# Patient Record
Sex: Female | Born: 1985 | Race: White | Hispanic: No | Marital: Married | Smoking: Never smoker
Health system: Southern US, Community
[De-identification: ages and names within clinical notes are randomized; demographics above are authoritative.]

## PROBLEM LIST (undated history)

## (undated) DIAGNOSIS — O139 Gestational [pregnancy-induced] hypertension without significant proteinuria, unspecified trimester: Secondary | ICD-10-CM

## (undated) HISTORY — PX: LYMPH NODE DISSECTION: SHX5087

---

## 2012-08-29 ENCOUNTER — Other Ambulatory Visit (HOSPITAL_COMMUNITY)
Admission: RE | Admit: 2012-08-29 | Discharge: 2012-08-29 | Disposition: A | Discharge status: Home / Self Care | Payer: 59 | Source: Ambulatory Visit | Attending: Family Medicine | Admitting: Family Medicine

## 2012-08-29 DIAGNOSIS — Z124 Encounter for screening for malignant neoplasm of cervix: Secondary | ICD-10-CM | POA: Insufficient documentation

## 2014-07-26 NOTE — L&D Delivery Note (Signed)
Delivery Note - asked to attend birth for Dr Cherly Hensenousins  First Stage: Labor onset: [phone] with AROM (induction of labor with cytotec overnight) Augmentation :  pitocin Analgesia /Anesthesia intrapartum: epidural AROM at 0548  Second Stage: Complete dilation at 0839 Onset of pushing at 1100 FHR second stage category 2  Delivery of a viable female Patt Mead(Emma)  at 1204 by CNM in LOA position no nuchal cord Cord double clamped after cessation of pulsation, cut by FOB Cord blood sample collected   Third Stage: [facility] @ 1207 Placenta disposition: hospital disposal Uterine tone initially boggy but responsive to massage / bleeding brisk  supraurtethral laceration identified with brisk bleeding - pressure to site until suture available to provider Anesthesia for repair: epidural Straight red catheter placed for landmark - no laceration to urethra - laceration stops 1/2 cm above urethra Repair 4-0 supra-urethral down left labial margin Small vaginal laceration behind hymenal band - repaired with two suture for hemostasis Perineum intact Est. Blood Loss (mL): 400  Complications: none  Mom to postpartum.  Baby to Couplet care / Skin to Skin.  Newborn: Birth Weight: 7 pounds 9.2 ounces Apgar Scores: 9-9 Feeding planned: breast  Marlinda MikeBAILEY, Aarushi Hemric CNM, MSN, FACNM 07/15/2015, 12:26 PM

## 2014-08-31 ENCOUNTER — Encounter (HOSPITAL_COMMUNITY): Payer: Self-pay | Admitting: Emergency Medicine

## 2014-08-31 ENCOUNTER — Emergency Department (HOSPITAL_COMMUNITY)
Admission: EM | Admit: 2014-08-31 | Discharge: 2014-09-01 | Disposition: A | Discharge status: Home / Self Care | Payer: Worker's Compensation | Attending: Emergency Medicine | Admitting: Emergency Medicine

## 2014-08-31 DIAGNOSIS — Z7721 Contact with and (suspected) exposure to potentially hazardous body fluids: Secondary | ICD-10-CM | POA: Diagnosis not present

## 2014-08-31 DIAGNOSIS — T7589XA Other specified effects of external causes, initial encounter: Secondary | ICD-10-CM

## 2014-08-31 NOTE — ED Provider Notes (Signed)
CSN: [id number]     Arrival date & time 08/31/14  2327 History  This chart was scribed for Joycie PeekBenjamin Voshon Petro, PA-C working with Arby BarretteMarcy Pfeiffer, MD by Elveria Risingimelie Horne, ED Scribe. This patient was seen in room TR09C/TR09C and the patient's care was started at 11:58 PM.   Chief Complaint  Patient presents with  . Body Fluid Exposure   The history is provided by the patient. No language interpreter was used.   HPI Comments: Marlowe AschoffKara C Protzman is a 29 y.o. female who presents to the Emergency Department complaining of tracheal fluid exposure tonight, while on the job. Patient works as Museum/gallery exhibitions officerMT; she states that tonight a Counsellorfellow medic was suctioning out a trach, went too far and the person began gagging. The person coughed up bloody sputum that flew into the patient's mouth, which she then swallowed. Denies any other medical problems at this time. Marland Kitchen.History reviewed. No pertinent past medical history. Past Surgical History  Procedure Laterality Date  . Lymph node dissection      Left shoulder   No family history on file. History  Substance Use Topics  . Smoking status: Never Smoker   . Smokeless tobacco: Not on file  . Alcohol Use: No   OB History    No data available     Review of Systems  Constitutional: Negative for fever.  Respiratory: Negative for shortness of breath.   Cardiovascular: Negative for chest pain.  Gastrointestinal: Negative for abdominal pain.      Allergies  Review of patient's allergies indicates no known allergies.  Home Medications   Prior to Admission medications   Not on File   Triage Vitals: BP 124/75 mmHg  Pulse 90  Temp(Src) 98.7 F (37.1 C) (Oral)  Resp 16  SpO2 98%  LMP 08/10/2014 (Exact Date) Physical Exam  Constitutional: She is oriented to person, place, and time. She appears well-developed and well-nourished. No distress.  HENT:  Head: Normocephalic and atraumatic.  Eyes: EOM are normal.  Neck: Neck supple. No tracheal deviation present.   Cardiovascular: Normal rate, regular rhythm and normal heart sounds.   Pulmonary/Chest: Effort normal. No respiratory distress.  Musculoskeletal: Normal range of motion.  Neurological: She is alert and oriented to person, place, and time.  Skin: Skin is warm and dry.  Psychiatric: She has a normal mood and affect. Her behavior is normal.  Nursing note and vitals reviewed.   ED Course  Procedures (including critical care time)  COORDINATION OF CARE: 12:01 AM- Discussed treatment plan with patient at bedside and patient agreed to plan.   Labs Review Labs Reviewed - No data to display  Imaging Review No results found.   EKG Interpretation None     Rapid HIV reported negative. MDM  Patient here for evaluation after potential exposure. Encounter performed per blood borne exposure protocol. Rapid HIV negative. Patient will be discharged home to follow-up with [facility] in one business day for postexposure counseling and necessary lab follow-up Final diagnoses:  Exposure, initial encounter    I personally performed the services described in this documentation, which was scribed in my presence. The recorded information has been reviewed and is accurate.    Earle GellBenjamin W Beardstownartner, PA-C 09/01/14 0102  Arby BarretteMarcy Pfeiffer, MD 09/03/14 [phone]

## 2014-08-31 NOTE — ED Notes (Signed)
Pt was exposed to tracheal fluid while on the job. Pt here for exposure testing.

## 2014-12-02 LAB — OB RESULTS CONSOLE GC/CHLAMYDIA
Chlamydia: NEGATIVE
GC PROBE AMP, GENITAL: NEGATIVE

## 2014-12-02 LAB — OB RESULTS CONSOLE RUBELLA ANTIBODY, IGM: Rubella: IMMUNE

## 2014-12-02 LAB — OB RESULTS CONSOLE HEPATITIS B SURFACE ANTIGEN: Hepatitis B Surface Ag: NEGATIVE

## 2014-12-02 LAB — OB RESULTS CONSOLE RPR: RPR: NONREACTIVE

## 2014-12-02 LAB — OB RESULTS CONSOLE HIV ANTIBODY (ROUTINE TESTING): HIV: NONREACTIVE

## 2014-12-02 LAB — OB RESULTS CONSOLE ABO/RH: RH Type: POSITIVE

## 2015-06-05 ENCOUNTER — Ambulatory Visit (INDEPENDENT_AMBULATORY_CARE_PROVIDER_SITE_OTHER): Payer: 59 | Admitting: Cardiology

## 2015-06-05 ENCOUNTER — Encounter: Payer: Self-pay | Admitting: Cardiology

## 2015-06-05 VITALS — BP 124/74 | HR 93 | Ht 67.0 in | Wt 219.0 lb

## 2015-06-05 DIAGNOSIS — R000 Tachycardia, unspecified: Secondary | ICD-10-CM | POA: Diagnosis not present

## 2015-06-05 DIAGNOSIS — R0609 Other forms of dyspnea: Secondary | ICD-10-CM

## 2015-06-05 DIAGNOSIS — R002 Palpitations: Secondary | ICD-10-CM | POA: Diagnosis not present

## 2015-06-05 DIAGNOSIS — R0600 Dyspnea, unspecified: Secondary | ICD-10-CM

## 2015-06-05 MED ORDER — LABETALOL HCL 100 MG PO TABS: 100.0000 mg | ORAL_TABLET | Freq: Two times a day (BID) | ORAL | Status: DC

## 2015-06-05 NOTE — Patient Instructions (Signed)
Medication Instructions:   START TAKING LABETALOL 100 MG TWICE DAILY     Testing/Procedures:  Your physician has requested that you have an echocardiogram. Echocardiography is a painless test that uses sound waves to create images of your heart. It provides your doctor with information about the size and shape of your heart and how well your heart's chambers and valves are working. This procedure takes approximately one hour. There are no restrictions for this procedure.    Follow-Up:  6 WEEKS WITH DR Delton SeeNELSON     If you need a refill on your cardiac medications before your next appointment, please call your pharmacy.

## 2015-06-05 NOTE — Progress Notes (Signed)
Patient ID: Tammie Ortiz, female   DOB: 03/24/1986, 29 y.o.   MRN: [id number]      Cardiology Office Note  Date:  06/05/2015   ID:  Tammie AschoffKara C Ortiz, DOB 06/02/1986, MRN [id number]  PCP:  Ronnie DossWILLARD,JENNIFER, PA-C  Cardiologist:  Lars MassonNELSON, Chrisoula Zegarra H, MD   Chief complain: tachycardia, palpitations   History of Present Illness: Tammie Ortiz is a 29 y.o. female who presents for evaluation of tachycardia and palpitations. She is currently [redacted] weeks pregnant, this is her 1. Pregnancy, pretty uncomplicated. 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 HR would go up to 140 and would fluctutae to 90' then back to 130. Associated SOB and mild dizziness, no syncope, no chest pain. Mild LE edema at the end of days that resolves by the next day. No orthopnea, PND.  She is otherwise completely healthy, no prior h/o heart disease. Father had MI in his 4140'.   No past medical history on file.  Past Surgical History  Procedure Laterality Date  . Lymph node dissection      Left shoulder    Current Outpatient Prescriptions  Medication Sig Dispense Refill  . Prenatal Vit-Fe Fumarate-FA (PRENATAL MULTIVITAMIN) TABS tablet Take 1 tablet by mouth daily at 12 noon.     No current facility-administered medications for this visit.   Allergies:   Latex and Percocet   Social History:  The patient  reports that she has never smoked. She does not have any smokeless tobacco history on file. She reports that she does not drink alcohol.   Family History:  The patient's family history is not on file.    ROS:  Please see the history of present illness.   Otherwise, review of systems are positive for none.   All other systems are reviewed and negative.    PHYSICAL EXAM: VS:  BP 124/74 mmHg  Pulse 93  Ht 5\' 7"  (1.702 m)  Wt 219 lb (99.338 kg)  BMI 34.29 kg/m2 , BMI Body mass index is 34.29 kg/(m^2). GEN: Well nourished, well developed,  in no acute distress HEENT: normal Neck: no JVD, carotid bruits, or masses Cardiac: RRR; no murmurs, rubs, or gallops,no edema  Respiratory:  clear to auscultation bilaterally, normal work of breathing GI: soft, nontender, nondistended, + BS MS: no deformity or atrophy Skin: warm and dry, no rash Neuro:  Strength and sensation are intact Psych: euthymic mood, full affect  EKG:  EKG is ordered today. The ekg ordered today demonstrates SR with sinus arrhythmia, otherwise normal.   Recent Labs: No results found for requested labs within last 365 days.   Lipid Panel No results found for: CHOL, TRIG, HDL, CHOLHDL, VLDL, LDLCALC, LDLDIRECT   Wt Readings from Last 3 Encounters:  06/05/15 219 lb (99.338 kg)      ASSESSMENT AND PLAN:  1.  Palpitations, sinus tachycardia, DOE - EMS strips show sinus tachycardia only - we will start labetalol 100 mg po BID, she can take it as needed, we will order an echocardiogram to evaluate for systolic function. No signs of CHF. TSH was normal. Follow up in 6 weeks, early postpartum.   Signed, Lars MassonNELSON, Journee Kohen H, MD  06/05/2015 2:52 PM    [facility] [address], [place], [place]  [address] Phone: [phone]; Fax: [phone]

## 2015-06-11 LAB — OB RESULTS CONSOLE GBS: STREP GROUP B AG: NEGATIVE

## 2015-06-13 ENCOUNTER — Other Ambulatory Visit: Payer: Self-pay

## 2015-06-13 ENCOUNTER — Telehealth: Payer: Self-pay | Admitting: Cardiology

## 2015-06-13 ENCOUNTER — Ambulatory Visit (HOSPITAL_COMMUNITY): Payer: 59 | Attending: Cardiology

## 2015-06-13 DIAGNOSIS — Z3A34 34 weeks gestation of pregnancy: Secondary | ICD-10-CM | POA: Insufficient documentation

## 2015-06-13 DIAGNOSIS — R000 Tachycardia, unspecified: Secondary | ICD-10-CM | POA: Insufficient documentation

## 2015-06-13 NOTE — Telephone Encounter (Signed)
New problem   Pt has questions about the Labetalol 100mg  that she takes. Please call pt

## 2015-06-13 NOTE — Telephone Encounter (Signed)
Pt.notified

## 2015-06-13 NOTE — Telephone Encounter (Signed)
She can take labetalol as needed, her echocardiogram is normal.

## 2015-06-13 NOTE — Telephone Encounter (Signed)
Called patient about her message. Patient is pregnant and worried about taking Labetalol, but patient has not had any episodes of ST. Informed patient that according to Dr. Lindaann SloughNelson's office note, that she could take as needed. Patient wants to know if it is okay to not take at all, and maybe see if there is something else she can take. Will forward to Dr. Delton SeeNelson for advisement.

## 2015-07-14 ENCOUNTER — Encounter (HOSPITAL_COMMUNITY): Payer: Self-pay | Admitting: *Deleted

## 2015-07-14 ENCOUNTER — Inpatient Hospital Stay (HOSPITAL_COMMUNITY)
Admission: AD | Admit: 2015-07-14 | Discharge: 2015-07-17 | DRG: 775 | Disposition: A | Discharge status: Home / Self Care | Payer: 59 | Source: Ambulatory Visit | Attending: Obstetrics and Gynecology | Admitting: Obstetrics and Gynecology

## 2015-07-14 DIAGNOSIS — O99214 Obesity complicating childbirth: Secondary | ICD-10-CM | POA: Diagnosis present

## 2015-07-14 DIAGNOSIS — O134 Gestational [pregnancy-induced] hypertension without significant proteinuria, complicating childbirth: Secondary | ICD-10-CM | POA: Diagnosis present

## 2015-07-14 DIAGNOSIS — O480 Post-term pregnancy: Secondary | ICD-10-CM | POA: Diagnosis present

## 2015-07-14 DIAGNOSIS — Z3A40 40 weeks gestation of pregnancy: Secondary | ICD-10-CM

## 2015-07-14 DIAGNOSIS — O133 Gestational [pregnancy-induced] hypertension without significant proteinuria, third trimester: Secondary | ICD-10-CM

## 2015-07-14 DIAGNOSIS — Z6837 Body mass index (BMI) 37.0-37.9, adult: Secondary | ICD-10-CM | POA: Diagnosis not present

## 2015-07-14 DIAGNOSIS — O139 Gestational [pregnancy-induced] hypertension without significant proteinuria, unspecified trimester: Secondary | ICD-10-CM | POA: Diagnosis present

## 2015-07-14 HISTORY — DX: Gestational (pregnancy-induced) hypertension without significant proteinuria, unspecified trimester: O13.9

## 2015-07-14 LAB — TYPE AND SCREEN
ABO/RH(D): B POS
ANTIBODY SCREEN: NEGATIVE

## 2015-07-14 LAB — URIC ACID: Uric Acid, Serum: 5.5 mg/dL (ref 2.3–6.6)

## 2015-07-14 LAB — CBC
HCT: 35.9 % — ABNORMAL LOW (ref 36.0–46.0)
HEMOGLOBIN: 11.9 g/dL — AB (ref 12.0–15.0)
MCH: 27.4 pg (ref 26.0–34.0)
MCHC: 33.1 g/dL (ref 30.0–36.0)
MCV: 82.5 fL (ref 78.0–100.0)
Platelets: 238 10*3/uL (ref 150–400)
RBC: 4.35 MIL/uL (ref 3.87–5.11)
RDW: 14.4 % (ref 11.5–15.5)
WBC: 12.3 10*3/uL — ABNORMAL HIGH (ref 4.0–10.5)

## 2015-07-14 LAB — COMPREHENSIVE METABOLIC PANEL
ALBUMIN: 3.3 g/dL — AB (ref 3.5–5.0)
ALT: 18 U/L (ref 14–54)
ANION GAP: 10 (ref 5–15)
AST: 30 U/L (ref 15–41)
Alkaline Phosphatase: 175 U/L — ABNORMAL HIGH (ref 38–126)
BILIRUBIN TOTAL: 0.5 mg/dL (ref 0.3–1.2)
BUN: 11 mg/dL (ref 6–20)
CHLORIDE: 105 mmol/L (ref 101–111)
CO2: 21 mmol/L — AB (ref 22–32)
Calcium: 9.2 mg/dL (ref 8.9–10.3)
Creatinine, Ser: 0.6 mg/dL (ref 0.44–1.00)
GFR calc Af Amer: >60 mL/min (ref >60)
GFR calc non Af Amer: >60 mL/min (ref >60)
GLUCOSE: 78 mg/dL (ref 65–99)
POTASSIUM: 4.1 mmol/L (ref 3.5–5.1)
SODIUM: 136 mmol/L (ref 135–145)
TOTAL PROTEIN: 7.3 g/dL (ref 6.5–8.1)

## 2015-07-14 LAB — ABO/RH: ABO/RH(D): B POS

## 2015-07-14 LAB — PROTEIN / CREATININE RATIO, URINE
Creatinine, Urine: 108 mg/dL
PROTEIN CREATININE RATIO: 0.11 mg/mg{creat} (ref 0.00–0.15)
TOTAL PROTEIN, URINE: 12 mg/dL

## 2015-07-14 MED ORDER — ONDANSETRON HCL 4 MG/2ML IJ SOLN
4.0000 mg | Freq: Four times a day (QID) | INTRAMUSCULAR | Status: DC | PRN
  Administered 2015-07-15: 4 mg via INTRAVENOUS
  Filled 2015-07-14: qty 2

## 2015-07-14 MED ORDER — OXYTOCIN BOLUS FROM INFUSION: 500.0000 mL | INTRAVENOUS | Status: DC

## 2015-07-14 MED ORDER — BUTORPHANOL TARTRATE 2 MG/ML IJ SOLN
2.0000 mg | INTRAMUSCULAR | Status: DC | PRN
  Administered 2015-07-14: 2 mg via INTRAVENOUS
  Filled 2015-07-14: qty 2

## 2015-07-14 MED ORDER — TERBUTALINE SULFATE 1 MG/ML IJ SOLN
0.2500 mg | Freq: Once | INTRAMUSCULAR | Status: DC | PRN
  Filled 2015-07-14: qty 1

## 2015-07-14 MED ORDER — LACTATED RINGERS IV SOLN
500.0000 mL | INTRAVENOUS | Status: DC | PRN
  Administered 2015-07-15 (×2): 500 mL via INTRAVENOUS

## 2015-07-14 MED ORDER — LACTATED RINGERS IV SOLN
INTRAVENOUS | Status: DC
  Administered 2015-07-14: 15:00:00 via INTRAVENOUS
  Administered 2015-07-15: 1000 mL via INTRAVENOUS
  Administered 2015-07-15: 11:00:00 via INTRAVENOUS

## 2015-07-14 MED ORDER — MISOPROSTOL 25 MCG QUARTER TABLET
25.0000 ug | ORAL_TABLET | ORAL | Status: DC | PRN
  Administered 2015-07-14 – 2015-07-15 (×3): 25 ug via VAGINAL
  Filled 2015-07-14: qty 1
  Filled 2015-07-14 (×3): qty 0.25

## 2015-07-14 MED ORDER — ACETAMINOPHEN 500 MG PO TABS
1000.0000 mg | ORAL_TABLET | Freq: Once | ORAL | Status: AC
Start: 2015-07-14 — End: 2015-07-14
  Administered 2015-07-14: 1000 mg via ORAL
  Filled 2015-07-14: qty 2

## 2015-07-14 MED ORDER — CITRIC ACID-SODIUM CITRATE 334-500 MG/5ML PO SOLN: 30.0000 mL | ORAL | Status: DC | PRN

## 2015-07-14 MED ORDER — OXYTOCIN 40 UNITS IN LACTATED RINGERS INFUSION - SIMPLE MED
62.5000 mL/h | INTRAVENOUS | Status: DC
  Administered 2015-07-15: 999 mL/h via INTRAVENOUS
  Filled 2015-07-14: qty 1000

## 2015-07-14 MED ORDER — OXYTOCIN 10 UNIT/ML IJ SOLN: 10.0000 [IU] | Freq: Once | INTRAMUSCULAR | Status: DC

## 2015-07-14 MED ORDER — LABETALOL HCL 100 MG PO TABS
100.0000 mg | ORAL_TABLET | Freq: Two times a day (BID) | ORAL | Status: DC
  Administered 2015-07-14 – 2015-07-15 (×3): 100 mg via ORAL
  Filled 2015-07-14 (×4): qty 1

## 2015-07-14 MED ORDER — LIDOCAINE HCL (PF) 1 % IJ SOLN
30.0000 mL | INTRAMUSCULAR | Status: DC | PRN
  Filled 2015-07-14: qty 30

## 2015-07-14 NOTE — Progress Notes (Signed)
S: notes more cramping since 2nd cytotec( 9 pm) H/a better Declines pain med  O: BP 125/74 mmHg  Pulse 90  Temp(Src) 98.4 F (36.9 C) (Oral)  Resp 18  Ht 5\' 5"  (1.651 m)  Wt 102.967 kg (227 lb)  BMI 37.77 kg/m2  SpO2 98%  LMP 10/06/2014 VE deferred  Tracing: cat 1 Ctx not seen  IMP: postterm P) cont with cytotec. Pain med prn

## 2015-07-14 NOTE — MAU Provider Note (Signed)
History     CSN: [id number]  Arrival date and time: 07/14/15 1243   None     No chief complaint on file.  HPI Comments: G1 @40 .1 weeks sent from office for elevated BP's. Reports left temporal HA that started last night, took Tylenol w/relief then returned this am. Some nausea today, no vomiting. Blurry vision last night, none today. No epigastric pain. Good FM. No LOF, ctx, or VB. Pregnancy complicated by obesity, excessive weight gain, and new onset HTN.    OB History    Gravida Para Term Preterm AB TAB SAB Ectopic Multiple Living   1               History reviewed. No pertinent past medical history.  Past Surgical History  Procedure Laterality Date  . Lymph node dissection      Left shoulder    History reviewed. No pertinent family history.  Social History  Substance Use Topics  . Smoking status: Never Smoker   . Smokeless tobacco: Never Used  . Alcohol Use: No    Allergies:  Allergies  Allergen Reactions  . Latex   . Percocet [Oxycodone-Acetaminophen] Hives    Prescriptions prior to admission  Medication Sig Dispense Refill Last Dose  . labetalol (NORMODYNE) 100 MG tablet Take 1 tablet (100 mg total) by mouth 2 (two) times daily. 180 tablet 3   . Prenatal Vit-Fe Fumarate-FA (PRENATAL MULTIVITAMIN) TABS tablet Take 1 tablet by mouth daily at 12 noon.   Taking    Review of Systems  Constitutional: Negative.   Eyes: Positive for blurred vision.  Respiratory: Negative.   Cardiovascular: Negative.   Gastrointestinal: Negative.   Genitourinary: Negative.   Musculoskeletal: Negative.   Neurological: Positive for headaches.  Endo/Heme/Allergies: Negative.   Psychiatric/Behavioral: Negative.    Physical Exam   Blood pressure 133/85, pulse 94, temperature 98.1 F (36.7 C), resp. rate 18, last menstrual period 10/06/2014.  Physical Exam  Constitutional: She is oriented to person, place, and time. She appears well-developed and well-nourished.  HENT:   Head: Normocephalic.  Neck: Normal range of motion. Neck supple.  Cardiovascular: Normal rate and regular rhythm.   Respiratory: Effort normal and breath sounds normal.  GI: Soft. Bowel sounds are normal.  gravid  Genitourinary:  deferred  Musculoskeletal: Normal range of motion.  Neurological: She is alert and oriented to person, place, and time.  Skin: Skin is warm and dry.  Psychiatric: She has a normal mood and affect.   Results for Tammie Ortiz, Tammie Ortiz (MRN [id number]) as of 07/14/2015 13:40  Ref. Range 07/14/2015 12:51 07/14/2015 12:57  Sodium Latest Ref Range: 135-145 mmol/L  136  Potassium Latest Ref Range: 3.5-5.1 mmol/L  4.1  Chloride Latest Ref Range: 101-111 mmol/L  105  CO2 Latest Ref Range: 22-32 mmol/L  21 (L)  BUN Latest Ref Range: 6-20 mg/dL  11  Creatinine Latest Ref Range: 0.44-1.00 mg/dL  0.60  Calcium Latest Ref Range: 8.9-10.3 mg/dL  9.2  EGFR (Non-African Amer.) Latest Ref Range: >60 mL/min  >60  EGFR (African American) Latest Ref Range: >60 mL/min  >60  Glucose Latest Ref Range: 65-99 mg/dL  78  Anion gap Latest Ref Range: 5-15   10  Alkaline Phosphatase Latest Ref Range: 38-126 U/L  175 (H)  Albumin Latest Ref Range: 3.5-5.0 g/dL  3.3 (L)  Uric Acid, Serum Latest Ref Range: 2.3-6.6 mg/dL  5.5  AST Latest Ref Range: 15-41 U/L  30  ALT Latest Ref Range: 14-54 U/L  18  Total Protein Latest Ref Range: 6.5-8.1 g/dL  7.3  Total Bilirubin Latest Ref Range: 0.3-1.2 mg/dL  0.5  WBC Latest Ref Range: 4.0-10.5 K/uL  12.3 (H)  RBC Latest Ref Range: 3.87-5.11 MIL/uL  4.35  Hemoglobin Latest Ref Range: 12.0-15.0 g/dL  11.9 (L)  HCT Latest Ref Range: 36.0-46.0 %  35.9 (L)  MCV Latest Ref Range: 78.0-100.0 fL  82.5  MCH Latest Ref Range: 26.0-34.0 pg  27.4  MCHC Latest Ref Range: 30.0-36.0 g/dL  33.1  RDW Latest Ref Range: 11.5-15.5 %  14.4  Platelets Latest Ref Range: 150-400 K/uL  238  Total Protein, Urine Latest Units: mg/dL 12   Protein Creatinine Ratio Latest Ref  Range: 0.00-0.15 mg/mgCre 0.11   Creatinine, Urine Latest Units: mg/dL 108.00    MAU Course  Procedures   Assessment and Plan  40.[redacted] weeks gestation Gestational HTN-no evidence of PEC Reactive NST  Admit, IOL, Labetalol-see admit orders. Dr. Maudry Diego notified-agrees with A/P/ MD to manage  Julianne Handler, N 07/14/2015, 1:44 PM

## 2015-07-14 NOTE — Progress Notes (Signed)
S: notes h/a still Declined med S/p cytotec #1    O: BP 121/73 mmHg  Pulse 104  Temp(Src) 98.3 F (36.8 C) (Oral)  Resp 16  Ht 5\' 5"  (1.651 m)  Wt 102.967 kg (227 lb)  BMI 37.77 kg/m2  SpO2 98%  LMP 10/06/2014  VE deferred  Tracing: cat 1 irreg ctx CBC    Component Value Date/Time   WBC 12.3* 07/14/2015 1257   RBC 4.35 07/14/2015 1257   HGB 11.9* 07/14/2015 1257   HCT 35.9* 07/14/2015 1257   PLT 238 07/14/2015 1257   MCV 82.5 07/14/2015 1257   MCH 27.4 07/14/2015 1257   MCHC 33.1 07/14/2015 1257   RDW 14.4 07/14/2015 1257   CMP Latest Ref Rng 07/14/2015  Glucose 65 - 99 mg/dL 78  BUN 6 - 20 mg/dL 11  Creatinine 1.610.44 - 0.961.00 mg/dL 0.450.60  Sodium 409135 - 811145 mmol/L 136  Potassium 3.5 - 5.1 mmol/L 4.1  Chloride 101 - 111 mmol/L 105  CO2 22 - 32 mmol/L 21(L)  Calcium 8.9 - 10.3 mg/dL 9.2  Total Protein 6.5 - 8.1 g/dL 7.3  Total Bilirubin 0.3 - 1.2 mg/dL 0.5  Alkaline Phos 38 - 126 U/L 175(H)  AST 15 - 41 U/L 30  ALT 14 - 54 U/L 18      IMP: Gest HTN Postterm P) tylenol 1000mg  po x1. Cont cytotec for cervical ripening. Light labor meal now

## 2015-07-14 NOTE — Progress Notes (Signed)
Pt c/o "feeling weird" .  Pt hyperventilationg.  Pt encouraged to take deep breaths.  No c/o pain.  Some nausea. Encouragement given and education given regarding the induction process.  Answered questions for pt and FOB.

## 2015-07-14 NOTE — MAU Note (Signed)
Pt presents to MAU from Dr Cherly [facility]ousins [facility]. Denies any vaginal bleeding or LOF. Reports good fetal movement

## 2015-07-14 NOTE — H&P (Signed)
  OB ADMISSION/ HISTORY & PHYSICAL:  Admission Date: 07/14/2015 12:43 PM  Admit Diagnosis: 40.[redacted] weeks gestation, Gestational HTN  Tammie AschoffKara C Ortiz is a 29 y.o. female presenting for IOL for Gestational HTN.  Prenatal History: G1P0   EDC:07/13/2015, by Last Menstrual Period   [facility]  Primary Ob Provider: Dr. Cherly Hensenousins Prenatal course complicated by obesity, excessive weight gain, new onset of gestational HTN.   Prenatal Labs: ABO, Rh:   B Pos Antibody:  Neg Rubella:   Immune RPR:   NR HBsAg:   Neg HIV:   Neg GBS:   Neg 1 hr GTT: 111 Genetic screen: neg  Medical / Surgical History :  Past medical history: History reviewed. No pertinent past medical history.   Past surgical history:  Past Surgical History  Procedure Laterality Date  . Lymph node dissection      Left shoulder    Family History: History reviewed. No pertinent family history.   Social History:  reports that she has never smoked. She has never used smokeless tobacco. She reports that she does not drink alcohol or use illicit drugs.  Allergies: Latex and Percocet   Current Medications at time of admission:  Prior to Admission medications   Medication Sig Start Date End Date Taking? Authorizing Provider  labetalol (NORMODYNE) 100 MG tablet Take 1 tablet (100 mg total) by mouth 2 (two) times daily. 06/05/15   Lars MassonKatarina H Nelson, MD  Prenatal Vit-Fe Fumarate-FA (PRENATAL MULTIVITAMIN) TABS tablet Take 1 tablet by mouth daily at 12 noon.    Historical Provider, MD     Review of Systems: +FM +HA No visual disturbances or epigastric pain No LOF No ctx No VB  Physical Exam:  VS: Blood pressure 133/85, pulse 94, temperature 98.1 F (36.7 C), resp. rate 18, last menstrual period 10/06/2014.  General: alert and oriented, appears comfortable Heart: RRR Lungs: Clear lung fields Abdomen: Gravid, soft and non-tender, non-distended / uterus: gravid,  non-tender Extremities: No edema Genitalia / VE:   FHR: baseline rate 145 / variability mod / accelerations + / no decelerations TOCO: UI  Assessment: 40.[redacted] weeks gestation Labor: latent FHR category I GBS: neg  Plan:  Admit, Cytotec ripening, analgesia prn, continue Labetalol, anticipate SVD. Dr. Cherly Hensenousins notified of admission / plan of care /MD to manage   Lawernce PittsBHAMBRI, Gara Kincade, N MSN, CNM 07/14/2015, 1:45 PM

## 2015-07-15 ENCOUNTER — Encounter (HOSPITAL_COMMUNITY): Payer: Self-pay | Admitting: *Deleted

## 2015-07-15 ENCOUNTER — Inpatient Hospital Stay (HOSPITAL_COMMUNITY): Payer: 59 | Admitting: Anesthesiology

## 2015-07-15 LAB — CBC
HEMATOCRIT: 31.9 % — AB (ref 36.0–46.0)
HEMOGLOBIN: 10.5 g/dL — AB (ref 12.0–15.0)
MCH: 27.4 pg (ref 26.0–34.0)
MCHC: 32.9 g/dL (ref 30.0–36.0)
MCV: 83.3 fL (ref 78.0–100.0)
Platelets: 224 10*3/uL (ref 150–400)
RBC: 3.83 MIL/uL — AB (ref 3.87–5.11)
RDW: 14.8 % (ref 11.5–15.5)
WBC: 18.3 10*3/uL — AB (ref 4.0–10.5)

## 2015-07-15 LAB — RPR: RPR: NONREACTIVE

## 2015-07-15 MED ORDER — EPHEDRINE 5 MG/ML INJ
10.0000 mg | INTRAVENOUS | Status: DC | PRN
  Filled 2015-07-15: qty 2

## 2015-07-15 MED ORDER — BENZOCAINE-MENTHOL 20-0.5 % EX AERO
1.0000 "application " | INHALATION_SPRAY | CUTANEOUS | Status: DC | PRN
  Administered 2015-07-15: 1 via TOPICAL
  Filled 2015-07-15 (×2): qty 56

## 2015-07-15 MED ORDER — LANOLIN HYDROUS EX OINT: TOPICAL_OINTMENT | CUTANEOUS | Status: DC | PRN

## 2015-07-15 MED ORDER — SIMETHICONE 80 MG PO CHEW: 80.0000 mg | CHEWABLE_TABLET | ORAL | Status: DC | PRN

## 2015-07-15 MED ORDER — FENTANYL 2.5 MCG/ML BUPIVACAINE 1/10 % EPIDURAL INFUSION (WH - ANES)
14.0000 mL/h | INTRAMUSCULAR | Status: DC | PRN
  Administered 2015-07-15 (×2): 14 mL/h via EPIDURAL
  Filled 2015-07-15 (×2): qty 125

## 2015-07-15 MED ORDER — LIDOCAINE HCL (PF) 1 % IJ SOLN
INTRAMUSCULAR | Status: DC | PRN
  Administered 2015-07-15: 2 mL via EPIDURAL
  Administered 2015-07-15 (×2): 4 mL via EPIDURAL

## 2015-07-15 MED ORDER — PHENYLEPHRINE 40 MCG/ML (10ML) SYRINGE FOR IV PUSH (FOR BLOOD PRESSURE SUPPORT)
80.0000 ug | PREFILLED_SYRINGE | INTRAVENOUS | Status: DC | PRN
  Filled 2015-07-15: qty 20
  Filled 2015-07-15: qty 2

## 2015-07-15 MED ORDER — DIPHENHYDRAMINE HCL 25 MG PO CAPS: 25.0000 mg | ORAL_CAPSULE | Freq: Four times a day (QID) | ORAL | Status: DC | PRN

## 2015-07-15 MED ORDER — TRAMADOL HCL 50 MG PO TABS: 50.0000 mg | ORAL_TABLET | Freq: Four times a day (QID) | ORAL | Status: DC | PRN

## 2015-07-15 MED ORDER — WITCH HAZEL-GLYCERIN EX PADS: 1.0000 "application " | MEDICATED_PAD | CUTANEOUS | Status: DC | PRN

## 2015-07-15 MED ORDER — BUPIVACAINE HCL (PF) 0.25 % IJ SOLN
INTRAMUSCULAR | Status: DC | PRN
  Administered 2015-07-15: 3 mL via EPIDURAL
  Administered 2015-07-15 (×2): 4 mL via EPIDURAL
  Administered 2015-07-15: 3 mL via EPIDURAL

## 2015-07-15 MED ORDER — SENNOSIDES-DOCUSATE SODIUM 8.6-50 MG PO TABS
2.0000 | ORAL_TABLET | ORAL | Status: DC
  Administered 2015-07-15: 2 via ORAL
  Filled 2015-07-15 (×2): qty 2

## 2015-07-15 MED ORDER — DIBUCAINE 1 % RE OINT: 1.0000 "application " | TOPICAL_OINTMENT | RECTAL | Status: DC | PRN

## 2015-07-15 MED ORDER — TERBUTALINE SULFATE 1 MG/ML IJ SOLN
0.2500 mg | Freq: Once | INTRAMUSCULAR | Status: DC | PRN
Start: 2015-07-15 — End: 2015-07-15
  Filled 2015-07-15: qty 1

## 2015-07-15 MED ORDER — DIPHENHYDRAMINE HCL 50 MG/ML IJ SOLN: 12.5000 mg | INTRAMUSCULAR | Status: DC | PRN

## 2015-07-15 MED ORDER — OXYTOCIN 40 UNITS IN LACTATED RINGERS INFUSION - SIMPLE MED
1.0000 m[IU]/min | INTRAVENOUS | Status: DC
  Administered 2015-07-15: 6 m[IU]/min via INTRAVENOUS
  Administered 2015-07-15: 12 m[IU]/min via INTRAVENOUS
  Administered 2015-07-15: 2 m[IU]/min via INTRAVENOUS

## 2015-07-15 MED ORDER — IBUPROFEN 800 MG PO TABS
800.0000 mg | ORAL_TABLET | Freq: Three times a day (TID) | ORAL | Status: DC
  Administered 2015-07-15 – 2015-07-17 (×6): 800 mg via ORAL
  Filled 2015-07-15 (×6): qty 1

## 2015-07-15 NOTE — Anesthesia Postprocedure Evaluation (Signed)
Anesthesia Post Note  Patient: Tammie Ortiz  Procedure(s) Performed: * No procedures listed *  Patient location during evaluation: Mother Baby Anesthesia Type: Epidural Level of consciousness: awake and alert and oriented Pain management: satisfactory to patient Vital Signs Assessment: post-procedure vital signs reviewed and stable Respiratory status: spontaneous breathing and nonlabored ventilation Cardiovascular status: stable Postop Assessment: no headache, no backache, no signs of nausea or vomiting, adequate PO intake and patient able to bend at knees (patient up walking) Anesthetic complications: no    Last Vitals:  Filed Vitals:   07/15/15 1400 07/15/15 1510  BP: 125/69 121/65  Pulse: 85 93  Temp:  36.8 C  Resp: 18 18    Last Pain:  Filed Vitals:   07/15/15 1551  PainSc: 0-No pain                 Hanae Waiters

## 2015-07-15 NOTE — Anesthesia Preprocedure Evaluation (Signed)
Anesthesia Evaluation  Patient identified by MRN, date of birth, ID band Patient awake    Reviewed: Allergy & Precautions, H&P , NPO status , Patient's Chart, lab work & pertinent test results  Airway Mallampati: II  TM Distance: >3 FB Neck ROM: full    Dental no notable dental hx.    Pulmonary neg pulmonary ROS,    Pulmonary exam normal breath sounds clear to auscultation       Cardiovascular negative cardio ROS Normal cardiovascular exam Rhythm:regular Rate:Normal     Neuro/Psych negative neurological ROS  negative psych ROS   GI/Hepatic negative GI ROS, Neg liver ROS,   Endo/Other  Morbid obesity  Renal/GU negative Renal ROS  negative genitourinary   Musculoskeletal   Abdominal   Peds  Hematology negative hematology ROS (+)   Anesthesia Other Findings   Reproductive/Obstetrics (+) Pregnancy                             Anesthesia Physical Anesthesia Plan  ASA: III  Anesthesia Plan: Epidural   Post-op Pain Management:    Induction:   Airway Management Planned:   Additional Equipment:   Intra-op Plan:   Post-operative Plan:   Informed Consent: I have reviewed the patients History and Physical, chart, labs and discussed the procedure including the risks, benefits and alternatives for the proposed anesthesia with the patient or authorized representative who has indicated his/her understanding and acceptance.     Plan Discussed with:   Anesthesia Plan Comments:         Anesthesia Quick Evaluation  

## 2015-07-15 NOTE — Anesthesia Procedure Notes (Signed)
Epidural Patient location during procedure: OB  Staffing Anesthesiologist: Tisheena Maguire Performed by: anesthesiologist   Preanesthetic Checklist Completed: patient identified, site marked, surgical consent, pre-op evaluation, timeout performed, IV checked, risks and benefits discussed and monitors and equipment checked  Epidural Patient position: sitting Prep: site prepped and draped and DuraPrep Patient monitoring: continuous pulse ox and blood pressure Approach: midline Location: L4-L5 Injection technique: LOR saline  Needle:  Needle type: Tuohy  Needle gauge: 17 G Needle length: 9 cm and 9 Needle insertion depth: 6.5 cm Catheter type: closed end flexible Catheter size: 19 Gauge Catheter at skin depth: 11 cm Test dose: negative  Assessment Events: blood not aspirated, injection not painful, no injection resistance, negative IV test and no paresthesia  Additional Notes Patient identified. Risks/Benefits/Options discussed with patient including but not limited to bleeding, infection, nerve damage, paralysis, failed block, incomplete pain control, headache, blood pressure changes, nausea, vomiting, reactions to medications, itching and postpartum back pain. Confirmed with bedside nurse the patient's most recent platelet count. Confirmed with patient that they are not currently taking any anticoagulation, have any bleeding history or any family history of bleeding disorders. Patient expressed understanding and wished to proceed. All questions were answered. Sterile technique was used throughout the entire procedure. Please see nursing notes for vital signs. Test dose was given through epidural catheter and negative prior to continuing to dose epidural or start infusion. Warning signs of high block given to the patient including shortness of breath, tingling/numbness in hands, complete motor block, or any concerning symptoms with instructions to call for help. Patient was given  instructions on fall risk and not to get out of bed. All questions and concerns addressed with instructions to call with any issues or inadequate analgesia.  Reason for block:procedure for pain

## 2015-07-15 NOTE — Progress Notes (Signed)
S; s/p stadol  Pain better  Due for cytotec # 3 VE 1/70/-2 deviated to left. cytotec placed  Tracing: baseline 150 Ctx : couplets  q 4mins  IMP: postterm Gest HTN on labetalol P)  IVF. Cont labetalol. Await active labor

## 2015-07-15 NOTE — Lactation Note (Signed)
This note was copied from the chart of Tammie PortugalKara Santiesteban. Lactation Consultation Note  Patient Name: Tammie Ortiz Today's Date: 07/15/2015 Reason for consult: Initial assessment Baby at 9 hr of life and mom reports that baby is not latching well. On digital exam baby could not extend tongue over gum ridge or lift to midline, and has poor lateralization. There is a noticeable frenulum that extends from the tip of the tongue. Baby has a heart shaped tip when she tried to extend tongue. Baby gags easily and has a high palette. The top labial frenulum is thick and extends to the bottom of the gum ridge. Baby has a wet mouth with bubbles in the cheeks.  When baby latched mom reports it is comfortable. Denies nipple or breast pain. No nipple trauma noted. Mom has wide space, simi cone shaped breast, with propionate areolas, and short shafted nipples. She did reports + breast changes during pregnancy.  Discussed DEBP, baby behavior, feeding frequency, baby belly size, voids, wt loss, breast changes, and nipple care. Mom declined pump at this time. Given lactation handouts. Aware of OP services and support group.    Maternal Data Has patient been taught Hand Expression?: Yes Does the patient have breastfeeding experience prior to this delivery?: No  Feeding Feeding Type: Breast Fed Length of feed: 15 min  LATCH Score/Interventions Latch: Repeated attempts needed to sustain latch, nipple held in mouth throughout feeding, stimulation needed to elicit sucking reflex. Intervention(s): Adjust position;Breast compression;Assist with latch  Audible Swallowing: Spontaneous and intermittent Intervention(s): Hand expression;Skin to skin  Type of Nipple: Everted at rest and after stimulation  Comfort (Breast/Nipple): Soft / non-tender     Hold (Positioning): Full assist, staff holds infant at breast Intervention(s): Support Pillows;Position options  LATCH Score: 7  Lactation Tools  Discussed/Used     Consult Status Consult Status: Follow-up Date: 07/16/15 Follow-up type: In-patient    Tammie Ortiz 07/15/2015, 9:17 PM

## 2015-07-15 NOTE — Progress Notes (Signed)
Tammie Ortiz is a 29 y.o. G1P0 at 2959w2d by ultrasound admitted for gest HTN  Subjective: Breathing and groining with ctx Pt on maternal right side holding abdomen Nausea. Dry heave Objective: BP 127/81 mmHg  Pulse 115  Temp(Src) 98.4 F (36.9 C) (Oral)  Resp 18  Ht 5\' 5"  (1.651 m)  Wt 102.967 kg (227 lb)  BMI 37.77 kg/m2  SpO2 98%  LMP 10/06/2014 I/O last 3 completed shifts: In: 935 [P.O.:480; I.V.:455] Out: 300 [Urine:300]    FHT:  FHR: 150 bpm, variability: (+) scalp stim,  accelerations:  Present,  decelerations:  Absent UC:   With IUPC: triplet ctx then couplets q 2 - 2 1/2 mins SVE:   5/90/0 asynclitic (+) bloody show. AROm scant fluid. ISE placed. IUPC placed after ctx   Labs: Lab Results  Component Value Date   WBC 12.3* 07/14/2015   HGB 11.9* 07/14/2015   HCT 35.9* 07/14/2015   MCV 82.5 07/14/2015   PLT 238 07/14/2015    Assessment / Plan: Gest HTN Postterm P) epidural reassessment. Watch FHR closely. Maternal right with peanut when comfortable   Anticipated MOD:  guarded  Jiovany Scheffel A 07/15/2015, 6:21 AM

## 2015-07-15 NOTE — Progress Notes (Signed)
I called and spoke to Dr. Cherly Hensenousins informing her of 4.5 min fetal HR deceleration at 0306, patient receiving epidural 0230, and of FHR change to minimal variability with baseline between 150 and 160.  No maternal fever.  MD verbalized understanding with no further orders given at this time.   Patient's position changed, fluid bolus given, and juice given to patient to drink. Wolfgang PhoenixLeigha Khy Pitre RN 07/15/15 [phone]

## 2015-07-16 ENCOUNTER — Encounter (HOSPITAL_COMMUNITY): Payer: Self-pay | Admitting: *Deleted

## 2015-07-16 LAB — CBC
HCT: 28.6 % — ABNORMAL LOW (ref 36.0–46.0)
Hemoglobin: 9.3 g/dL — ABNORMAL LOW (ref 12.0–15.0)
MCH: 27.3 pg (ref 26.0–34.0)
MCHC: 32.5 g/dL (ref 30.0–36.0)
MCV: 83.9 fL (ref 78.0–100.0)
Platelets: 204 10*3/uL (ref 150–400)
RBC: 3.41 MIL/uL — ABNORMAL LOW (ref 3.87–5.11)
RDW: 14.9 % (ref 11.5–15.5)
WBC: 13.2 10*3/uL — ABNORMAL HIGH (ref 4.0–10.5)

## 2015-07-16 LAB — COMPREHENSIVE METABOLIC PANEL
ALT: 18 U/L (ref 14–54)
AST: 35 U/L (ref 15–41)
Albumin: 2.6 g/dL — ABNORMAL LOW (ref 3.5–5.0)
Alkaline Phosphatase: 122 U/L (ref 38–126)
Anion gap: 7 (ref 5–15)
BUN: 10 mg/dL (ref 6–20)
CO2: 24 mmol/L (ref 22–32)
Calcium: 8.7 mg/dL — ABNORMAL LOW (ref 8.9–10.3)
Chloride: 106 mmol/L (ref 101–111)
Creatinine, Ser: 0.64 mg/dL (ref 0.44–1.00)
GFR calc Af Amer: >60 mL/min (ref >60)
GFR calc non Af Amer: >60 mL/min (ref >60)
Glucose, Bld: 92 mg/dL (ref 65–99)
Potassium: 4 mmol/L (ref 3.5–5.1)
Sodium: 137 mmol/L (ref 135–145)
Total Bilirubin: 0.4 mg/dL (ref 0.3–1.2)
Total Protein: 5.7 g/dL — ABNORMAL LOW (ref 6.5–8.1)

## 2015-07-16 NOTE — Lactation Note (Signed)
This note was copied from the chart of Tammie Ortiz. Lactation Consultation Note Follow up visit at 31 hours of age.  Mom reports having pain with latch and last feeding was several hours ago.  Baby asleep with visitor,  Encouraged to undress and baby awakened.  Mom instructed on hand expression with several drops expressed.  Mom noted to have wide spaced someone cone shaped breasts as previously noted in chart.  Baby latched in cross cradle hold to left breast.  LC rolled out lower lips and baby maintained wide latch with flanged lips.  Few swallows audible.   LC answered basic questions related to breastfeeding.   Mom to call for assist as needed.   Patient Name: Tammie Verlan FriendsKara Grigg AVWUJ'WToday's Date: 07/16/2015 Reason for consult: Follow-up assessment;Breast/nipple pain   Maternal Data    Feeding Feeding Type: Breast Fed Length of feed:  (several minutes observed)  LATCH Score/Interventions Latch: Repeated attempts needed to sustain latch, nipple held in mouth throughout feeding, stimulation needed to elicit sucking reflex. Intervention(s): Adjust position;Assist with latch;Breast massage;Breast compression  Audible Swallowing: A few with stimulation Intervention(s): Skin to skin;Hand expression;Alternate breast massage  Type of Nipple: Everted at rest and after stimulation  Comfort (Breast/Nipple): Soft / non-tender (denies pain with this latch)     Hold (Positioning): Assistance needed to correctly position infant at breast and maintain latch. Intervention(s): Breastfeeding basics reviewed;Support Pillows;Position options;Skin to skin  LATCH Score: 7  Lactation Tools Discussed/Used     Consult Status Consult Status: Follow-up Date: 07/17/15 Follow-up type: In-patient    Tammie Ortiz, Tammie Ortiz 07/16/2015, 7:33 PM

## 2015-07-16 NOTE — Progress Notes (Signed)
Patient ID: Tammie Ortiz, female   DOB: 06/29/1986, 29 y.o.   MRN: [id number] PPD # 1 SVD  S:  Reports feeling well             Tolerating po/ No nausea or vomiting             Bleeding is light             Pain controlled with ibuprofen (OTC)             Up ad lib / ambulatory / voiding without difficulties    Newborn  Information for the patient's newborn:  Wilford SportsCarranza, Girl Diannia RuderKara [[id number]][[id number]]  female  breast feeding     O:  A & O x 3, in no apparent distress              VS:  Filed Vitals:   07/15/15 1510 07/15/15 1920 07/16/15 0430 07/16/15 0606  BP: 121/65 112/74 118/57 119/71  Pulse: 93 84 77 78  Temp: 98.3 F (36.8 C) 98.2 F (36.8 C) 98.3 F (36.8 C) 98.3 F (36.8 C)  TempSrc: Oral Oral Oral Oral  Resp: 18 18 18 18   Height:      Weight:      SpO2: 98% 98% 100%     LABS:  Recent Labs  07/15/15 1640 07/16/15 0535  WBC 18.3* 13.2*  HGB 10.5* 9.3*  HCT 31.9* 28.6*  PLT 224 204    Blood type: B POS (12/19 1257)  Rubella: Immune (05/09 0000)   I&O: I/O last 3 completed shifts: In: 5558.3 [P.O.:1921; I.V.:3637.3] Out: 2212 [Urine:1690; Blood:522]             Lungs: Clear and unlabored  Heart: regular rate and rhythm / no murmurs  Abdomen: soft, non-tender, non-distended             Fundus: firm, non-tender, U-2  Perineum: repair healing well  Lochia: minimal  Extremities: trace edema, no calf pain or tenderness, No Homans    A/P: PPD # 1  29 y.o., G1P1001   Principal Problem:    Postpartum care following vaginal delivery (12/20)  Active Problems:    Gestational hypertension   Doing well - stable status  Routine post partum orders  Anticipate discharge tomorrow    Raelyn MoraAWSON, Rainy Rothman, M, MSN, CNM 07/16/2015, 9:27 AM

## 2015-07-17 ENCOUNTER — Encounter (HOSPITAL_COMMUNITY): Payer: Self-pay | Admitting: Certified Nurse Midwife

## 2015-07-17 MED ORDER — IBUPROFEN 800 MG PO TABS: 800.0000 mg | ORAL_TABLET | Freq: Three times a day (TID) | ORAL | Status: AC

## 2015-07-17 NOTE — Progress Notes (Signed)
PPD 2 SVD  S:  Reports feeling tired and emotional             Tolerating po/ No nausea or vomiting             Bleeding is light             Pain controlled withmotrin mostly             Up ad lib / ambulatory / voiding QS  Newborn breast feeding  / tongue clipped this am O:               VS: BP 136/65 mmHg  Pulse 78  Temp(Src) 98 F (36.7 C) (Oral)  Resp 20  Ht 5\' 5"  (1.651 m)  Wt 102.967 kg (227 lb)  BMI 37.77 kg/m2  SpO2 100%  LMP 10/06/2014  Breastfeeding? Unknown   LABS:              Recent Labs  07/15/15 1640 07/16/15 0535  WBC 18.3* 13.2*  HGB 10.5* 9.3*  PLT 224 204               Blood type: --/--/B POS, B POS (12/19 1257)  Rubella: Immune (05/09 0000)                     I&O: net negative 700             Physical Exam:             Alert and oriented X3  Abdomen: soft, non-tender, non-distended              Fundus: firm, non-tender, Ueven  Perineum: no edema  Lochia: light  Extremities: 2+ pedal edema, no calf pain or tenderness    A: PPD # 2   Doing well - stable status  P: Routine post partum orders  DC home               Marlinda MikeBAILEY, Zafiro Routson CNM, MSN, [facility]ACNM 07/17/2015, 11:58 AM

## 2015-07-17 NOTE — Lactation Note (Signed)
This note was copied from the chart of [name]. Lactation Consultation Note  Patient Name: [name] WUJWJ'XToday's Date: 07/17/2015 Reason for consult: Follow-up assessment;Breast/nipple pain Visited with Mom and baby on day of discharge, baby 8146 hrs old.  Mom complaining of some pain during latch.  Baby cueing so observed latch.  Using football hold, added another pillow under baby, and Mom latched baby deeply independently.  Showed FOB how to observe baby's lower lip, and uncurl by tugging on chin.  Baby with numerous swallowing.  Encouraged Mom to express colostrum onto nipples following feedings, no trauma noted on nipples.  Encouraged continued skin to skin and feeding on cue.  Engorgement prevention and treatment discussed.  Reminded Mom of OP Lactation services available to her.  To follow up with Pediatrician prn.  Slight jaundice noted, discussed importance of frequent feedings.  Call prn after discharge.    Consult Status Consult Status: Complete Date: 07/17/15 Follow-up type: Call as needed    Judee ClaraSmith, Laine Fonner E 07/17/2015, 10:19 AM

## 2015-07-17 NOTE — Lactation Note (Signed)
This note was copied from the chart of Tammie PortugalKara Schoff. Lactation Consultation Note  Patient Name: Tammie Tammie FriendsKara Ortiz RUEAV'WToday's Date: 07/17/2015 Reason for consult: Follow-up assessment;Breast/nipple pain  Requested by MD to observe/assist with latch following frenotomy.  Mom using cross cradle hold, latched baby well without any discomfort.  LC able to observe tongue cupping under breast during latch, swallows heard.   To follow up with Pediatrician after discharge.  Call Lactation prn. Consult Status Consult Status: Complete Date: 07/17/15 Follow-up type: Call as needed    Judee ClaraSmith, Devaun Hernandez E 07/17/2015, 11:47 AM

## 2015-07-17 NOTE — Discharge Summary (Signed)
Obstetric Discharge Summary  Reason for Admission: induction of labor - gestational hypertension, postterm Prenatal Procedures: NST and ultrasound Intrapartum Procedures: spontaneous vaginal delivery Postpartum Procedures: none Complications-Operative and Postpartum: supra-urethral laceration  HEMOGLOBIN  Date Value Ref Range Status  07/16/2015 9.3* 12.0 - 15.0 g/dL Final   HCT  Date Value Ref Range Status  07/16/2015 28.6* 36.0 - 46.0 % Final    Physical Exam:  General: alert, cooperative and no distress Lochia: appropriate Uterine Fundus: firm Incision: healing well DVT Evaluation: No evidence of DVT seen on physical exam.  Hospital course: pt was admitted and had cytotec placement. Labor started then spaced ctx, pitocin augmentation done. Subsequent delivery and uncomplicated pp course.  Discharge Diagnoses: postterm delivered / gestational hypertension - delivered with stable BP  Discharge Information: Date: 07/17/2015 Activity: pelvic rest Diet: routine Medications: PNV and Ibuprofen Condition: stable Instructions: refer to practice specific booklet Discharge to: home Follow-up Information    Follow up with Lindie Roberson A, MD. Schedule an appointment as soon as possible for a visit in 6 weeks.   Specialty:  Obstetrics and Gynecology   Contact information:   [address] [place] [id number] [phone]       Newborn Data: Live born female  Birth Weight: 7 lb 9.2 oz (3436 g) APGAR: 9, 9  Home with mother.  Marlinda MikeBAILEY, TANYA 07/17/2015, 12:01 PM

## 2015-07-30 ENCOUNTER — Ambulatory Visit: Payer: Self-pay | Admitting: Cardiology

## 2015-10-17 ENCOUNTER — Encounter (HOSPITAL_BASED_OUTPATIENT_CLINIC_OR_DEPARTMENT_OTHER): Payer: Self-pay | Admitting: *Deleted

## 2015-10-17 ENCOUNTER — Emergency Department (HOSPITAL_BASED_OUTPATIENT_CLINIC_OR_DEPARTMENT_OTHER)
Admission: EM | Admit: 2015-10-17 | Discharge: 2015-10-17 | Disposition: A | Discharge status: Home / Self Care | Payer: Worker's Compensation | Attending: Emergency Medicine | Admitting: Emergency Medicine

## 2015-10-17 ENCOUNTER — Emergency Department (HOSPITAL_BASED_OUTPATIENT_CLINIC_OR_DEPARTMENT_OTHER): Payer: Worker's Compensation

## 2015-10-17 DIAGNOSIS — S67196A Crushing injury of right little finger, initial encounter: Secondary | ICD-10-CM | POA: Insufficient documentation

## 2015-10-17 DIAGNOSIS — Y9389 Activity, other specified: Secondary | ICD-10-CM | POA: Diagnosis not present

## 2015-10-17 DIAGNOSIS — Y9289 Other specified places as the place of occurrence of the external cause: Secondary | ICD-10-CM | POA: Diagnosis not present

## 2015-10-17 DIAGNOSIS — W230XXA Caught, crushed, jammed, or pinched between moving objects, initial encounter: Secondary | ICD-10-CM | POA: Insufficient documentation

## 2015-10-17 DIAGNOSIS — Z79899 Other long term (current) drug therapy: Secondary | ICD-10-CM | POA: Insufficient documentation

## 2015-10-17 DIAGNOSIS — Z91040 Latex allergy status: Secondary | ICD-10-CM | POA: Insufficient documentation

## 2015-10-17 DIAGNOSIS — S6991XA Unspecified injury of right wrist, hand and finger(s), initial encounter: Secondary | ICD-10-CM | POA: Diagnosis present

## 2015-10-17 DIAGNOSIS — Y998 Other external cause status: Secondary | ICD-10-CM | POA: Diagnosis not present

## 2015-10-17 DIAGNOSIS — S6710XA Crushing injury of unspecified finger(s), initial encounter: Secondary | ICD-10-CM

## 2015-10-17 NOTE — ED Notes (Signed)
Workman's comp case. Pt reports not taking any medication for pain. Pt is currently breast feeding. Pt walked to xray.

## 2015-10-17 NOTE — ED Provider Notes (Signed)
CSN: [id number]     Arrival date & time 10/17/15  1756 History     Chief Complaint  Patient presents with  . Hand Injury    The history is provided by the patient. No language interpreter was used.   HPI Comments: Tammie Ortiz is a 30 y.o. female who presents to the Emergency Department complaining of injury to the fifth digit of the right hand. 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 Complains of pain only to the fifth digit. Patient is right-hand dominant. There is moderate swelling to the finger. No deformity. Complains of some burning pain but no numbness.  Past Medical History  Diagnosis Date  . Gestational hypertension    Past Surgical History  Procedure Laterality Date  . Lymph node dissection      Left shoulder   Family History  Problem Relation Age of Onset  . Diabetes Father    Social History  Substance Use Topics  . Smoking status: Never Smoker   . Smokeless tobacco: Never Used  . Alcohol Use: No   OB History    Gravida Para Term Preterm AB TAB SAB Ectopic Multiple Living   0 1     Review of Systems  Skin: Negative for wound.  Neurological: Negative for weakness and numbness.  All other systems reviewed and are negative.   Allergies  Percocet and Latex  Home Medications   Prior to Admission medications   Medication Sig Start Date End Date Taking? Authorizing Provider  ibuprofen (ADVIL,MOTRIN) 800 MG tablet Take 1 tablet (800 mg total) by mouth every 8 (eight) hours. 07/17/15   Marlinda Mike, CNM  Prenatal Vit-Fe Fumarate-FA (PRENATAL MULTIVITAMIN) TABS tablet Take 1 tablet by mouth daily at 12 noon.    Historical Provider, MD   BP 138/80 mmHg  Pulse 74  Temp(Src) 98.2 F (36.8 C) (Oral)  Resp 18  Ht  (1.651 m)  Wt 205 lb (92.987 kg)  BMI 34.11 kg/m2  SpO2 100%  Breastfeeding? Yes Physical Exam  Constitutional: She is oriented to person, place, and time. She  appears well-developed and well-nourished. No distress.  HENT:  Head: Normocephalic and atraumatic.  Mouth/Throat: Oropharynx is clear and moist.  Eyes: EOM are normal. Pupils are equal, round, and reactive to light.  Neck: Normal range of motion. Neck supple.  Cardiovascular: Normal rate.   Pulmonary/Chest: Effort normal.  Abdominal: Soft. Bowel sounds are normal.  Musculoskeletal: Normal range of motion. She exhibits tenderness. She exhibits no edema.  Patient has diffuse tenderness to the fifth digit of the right hand. There is no obvious deformity. Good distal cap refill. No tenderness to the hand itself or wrist. Good range of motion but limited due to pain.  Neurological: She is alert and oriented to person, place, and time.  Sensation intact. Flexion and extension intact though painful.   Skin: Skin is warm and dry. No rash noted. No erythema.  Psychiatric: She has a normal mood and affect. Her behavior is normal.  Nursing note and vitals reviewed.   ED Course  Procedures (including critical care time) DIAGNOSTIC STUDIES: Oxygen Saturation is 100% on RA, normal by my interpretation.    COORDINATION OF CARE: 7:00 PM-Discussed treatment plan which includes Xray with pt at bedside and pt agreed to plan.   Labs Review Labs Reviewed - No data to display  Imaging Review Dg Hand Complete Right  10/17/2015  CLINICAL DATA:  Pain and swelling of the right fifth finger after injury. EXAM: RIGHT HAND - COMPLETE 3+ VIEW COMPARISON:  None. FINDINGS: There is no evidence of fracture or dislocation. There is no evidence of arthropathy or other focal bone abnormality. Soft tissues are unremarkable. IMPRESSION: Negative. Electronically Signed   By: Burman NievesWilliam  Stevens M.D.   On: 10/17/2015 18:36   I have personally reviewed and evaluated these images and lab results as part of my medical decision-making.   EKG Interpretation None      MDM   Final diagnoses:  Crush injury to finger,  initial encounter   I personally performed the services described in this documentation, which was scribed in my presence. The recorded information has been reviewed and is accurate.   X-ray without any evidence of bony deformity. Advised to keep elevated and ice to control swelling. The patient has ongoing pain for difficulty using hand she is to follow-up with the hand surgeon. Return precautions have been given.   Loren Raceravid Dempsy Damiano, MD 10/17/15 1900

## 2015-10-17 NOTE — Discharge Instructions (Signed)
Crush Injury, Fingers or Toes °A crush injury to the fingers or toes means the tissues have been damaged by being squeezed (compressed). There will be bleeding into the tissues and swelling. Often, blood will collect under the skin. When this happens, the skin on the finger often dies and may slough off (shed) 1 week to 10 days later. Usually, new skin is growing underneath. If the injury has been too severe and the tissue does not survive, the damaged tissue may begin to turn black over several days.  °Wounds which occur because of the crushing may be stitched (sutured) shut. However, crush injuries are more likely to become infected than other injuries. These wounds may not be closed as tightly as other types of cuts to prevent infection. Nails involved are often lost. These usually grow back over several weeks.  °DIAGNOSIS °X-rays may be taken to see if there is any injury to the bones. °TREATMENT °Broken bones (fractures) may be treated with splinting, depending on the fracture. Often, no treatment is required for fractures of the last bone in the fingers or toes. °HOME CARE INSTRUCTIONS  °· The crushed part should be raised (elevated) above the heart or center of the chest as much as possible for the first several days or as directed. This helps with pain and lessens swelling. Less swelling increases the chances that the crushed part will survive. °· Put ice on the injured area. °¨ Put ice in a plastic bag. °¨ Place a towel between your skin and the bag. °¨ Leave the ice on for 15-20 minutes, 03-04 times a day for the first 2 days. °· Only take over-the-counter or prescription medicines for pain, discomfort, or fever as directed by your caregiver. °· Use your injured part only as directed. °· Change your bandages (dressings) as directed. °· Keep all follow-up appointments as directed by your caregiver. Not keeping your appointment could result in a chronic or permanent injury, pain, and disability. If there is  any problem keeping the appointment, you must call to reschedule. °SEEK IMMEDIATE MEDICAL CARE IF:  °· There is redness, swelling, or increasing pain in the wound area. °· Pus is coming from the wound. °· You have a fever. °· You notice a bad smell coming from the wound or dressing. °· The edges of the wound do not stay together after the sutures have been removed. °· You are unable to move the injured finger or toe. °MAKE SURE YOU:  °· Understand these instructions. °· Will watch your condition. °· Will get help right away if you are not doing well or get worse. °  °This information is not intended to replace advice given to you by your health care provider. Make sure you discuss any questions you have with your health care provider. °  °Document Released: 07/12/2005 Document Revised: 10/04/2011 Document Reviewed: 11/27/2010 °Elsevier Interactive Patient Education ©2016 [employer] ° °

## 2015-10-17 NOTE — ED Notes (Signed)
Delay in discharge due to needing to obtain drug screen. Waiting on tech to obtain sample fom patient. Pt was made aware of the reason for the delay.

## 2015-10-17 NOTE — ED Notes (Signed)
Pt works for [employer].  States that her right hand was caught in between the stretcher and wall.  Hand swollen.

## 2015-11-18 ENCOUNTER — Encounter: Payer: Self-pay | Admitting: Physical Therapy

## 2015-11-18 ENCOUNTER — Ambulatory Visit: Payer: 59 | Attending: Family Medicine | Admitting: Physical Therapy

## 2015-11-18 DIAGNOSIS — M545 Low back pain, unspecified: Secondary | ICD-10-CM

## 2015-11-18 DIAGNOSIS — M6281 Muscle weakness (generalized): Secondary | ICD-10-CM

## 2015-11-18 NOTE — Therapy (Signed)
[facility] [address] [place], [place], [address] Phone: [phone]   Fax:  [phone]  Physical Therapy Evaluation  Patient Details  Name: Tammie Ortiz MRN: [id number] Date of Birth: 09-30-1985 Referring Provider: Carilyn Goodpasture, PA-C  Encounter Date: 11/18/2015      PT End of Session - 11/18/15 1043    Visit Number 1   Date for PT Re-Evaluation 01/13/16   PT Start Time 0930   PT Stop Time 1015   PT Time Calculation (min) 45 min   Activity Tolerance Patient limited by pain   Behavior During Therapy [facility] for tasks assessed/performed      Past Medical History  Diagnosis Date  . Gestational hypertension     Past Surgical History  Procedure Laterality Date  . Lymph node dissection      Left shoulder    There were no vitals filed for this visit.       Subjective Assessment - 11/18/15 0937    Subjective Patient reports injury at work on 10/29/2015 while lifting a stretcher with another co-worker. While lifting the stretcher into truck it wobbled and then back pain.  Lifting through the day increase pain.  Later on her doctor took patient out of work due to bruising and maybe tendon was torn.    How long can you sit comfortably? pain at level 3/10   Patient Stated Goals no pain to return to work   Currently in Pain? Yes   Pain Score 3    Pain Location Back   Pain Orientation Lower   Pain Descriptors / Indicators Shooting;Tightness;Burning   Pain Type Acute pain   Pain Onset More than a month ago   Pain Frequency Intermittent   Aggravating Factors  sitting, bending forward, sit to stand   Pain Relieving Factors nothing helps   Effect of Pain on Daily Activities take care of 60 month old, bathing her, lifting her            [facility] PT Assessment - 11/18/15 0001    Assessment   Medical Diagnosis M54.9 Dorsalgia, unspecified   Referring Provider Carilyn Goodpasture, PA-C   Onset Date/Surgical Date  10/29/15   Prior Therapy None   Precautions   Precautions Back   Precaution Comments lift no more than 20 # , no staying in one postion   Restrictions   Weight Bearing Restrictions No   Balance Screen   Has the patient fallen in the past 6 months No   Has the patient had a decrease in activity level because of a fear of falling?  No   Is the patient reluctant to leave their home because of a fear of falling?  No   Home [employer] information [employer] manager residence   Prior Function   Level of Independence Independent   Vocation Full time employment  out of work at this time   Optician, dispensing, bending, climibing   Cognition   Overall Cognitive Status Within Functional Limits for tasks assessed   Observation/Other Assessments   Observations patient will move slowly and guarded due to pain.   Focus on Therapeutic Outcomes (FOTO)  51% limitation  goal is 29% limitation CJ   Posture/Postural Control   Posture/Postural Control No significant limitations   ROM / Strength   AROM / PROM / Strength AROM;PROM;Strength   AROM   Lumbar Extension decreased by 25%   Lumbar - Right Side Bend decreased by 25%   Lumbar -  Left Side Bend decreased by 25%   PROM   Overall PROM  Within functional limits for tasks performed   Strength   Right Hip Extension 4-/5   Right Hip ABduction 3/5  increased in back pain and made difficult to move   Left Hip Extension 4-/5   Left Hip ABduction 3/5   Palpation   SI assessment  L1-L5 rotated left   Palpation comment left ilium rotated posteriorly rotated; sacrum rotated left   Special Tests    Special Tests Sacrolliac Tests   Sacroiliac Tests  Pelvic Compression   Pelvic Dictraction   Findings Positive   Side  Left   Comment pain   Pelvic Compression   Findings Positive   Side Left   comment pain   Bed Mobility   Bed Mobility Rolling Left;Rolling Right   Rolling Right 6: Modified independent (Device/Increase time)  back pain    Rolling Left 6: Modified independent (Device/Increase time)  back pain                 Pelvic Floor Special Questions - 11/18/15 0001    Diastasis Recti 1 finger width          [facility] Adult PT Treatment/Exercise - 11/18/15 0001    Modalities   Modalities Ultrasound   Ultrasound   Ultrasound Location left lumbar sacral area  prone with pillow under hips   Ultrasound Parameters 1.2w/cm2, 100%, 1 mhz, 8 min.    Ultrasound Goals Pain                PT Education - 11/18/15 1043    Education provided Yes   Education Details bending at hips, tighten abdominal with lifting baby, no twisting    Person(s) Educated Patient   Methods Explanation   Comprehension Verbalized understanding          PT Short Term Goals - 11/18/15 1055    PT SHORT TERM GOAL #1   Title independent with initial HEP   Time 4   Period Weeks   Status New   PT SHORT TERM GOAL #2   Title independent with correct body mechanics with care of her baby and household chores   Time 4   Period Weeks   Status New   PT SHORT TERM GOAL #3   Title pain with sitting decreased >/= 25%   Time 4   Period Weeks   Status New   PT SHORT TERM GOAL #4   Title pelvis in correct alignment so she is able to lift her baby with >/= 25%   Time 4   Period Weeks           PT Long Term Goals - 11/18/15 1057    PT LONG TERM GOAL #1   Title independent with HEP   Time 8   Period Weeks   Status New   PT LONG TERM GOAL #2   Title ability to perform facilitated work tasks with >/= 75% reduction in pain and proper body mechanics   Time 8   Period Weeks   Status New   PT LONG TERM GOAL #3   Title 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 Patient reports while she is lifting the stretcher into the trunk she was wobbling in back then felt pain. Patient did go to the companys doctore, [employer], but the doctore stated it was not worker somp because it was not an accident. Patient reports her intermittent pain is 3/10 in lumbar area that is worse with sitting, going from sit to stand, bending forward and  moving in bed. Lumbar extension, right sidebend and left rotation decreased by 25%.  Left ilium is posteriorly rotated and sacrum rotated left.  L3-L5 rotated left.  Palpable tenderness located in left lumbar sacral area and left lumbar praspinals. Bilateral hip abduction 3+/5 and while testing increased lumbar pain and limited with bed mobility.  Patient was not able to tolerate soft tissue work to lumbar muscles due to pain.  Therapist had difficulty with full assessment due to back pain.  Patient is of low complexity.  Patient will benefit form physical therapy to reduce pain and improve movement.    Rehab Potential Excellent   Clinical Impairments Affecting Rehab Potential None   PT Frequency 2x / week   PT Duration 8 weeks   PT Treatment/Interventions Cryotherapy;Electrical Stimulation;Ultrasound;Traction;Iontophoresis 4mg /ml Dexamethasone;Therapeutic activities;Therapeutic exercise;Patient/family education;Neuromuscular re-education;Manual techniques;Dry needling   PT Next Visit Plan pain relief, modalites as needed, soft tissue work, abdominal bracing,    PT Home Exercise Plan back stabilization, body mechanics   Recommended Other Services None   Consulted and Agree with Plan of Care Patient      Patient will benefit  from skilled therapeutic intervention in order to improve the following deficits and impairments:  Pain, Increased fascial restricitons, Decreased mobility, Decreased coordination, Increased muscle spasms, Decreased activity tolerance, Decreased endurance, Decreased range of motion, Decreased strength, Impaired flexibility  Visit Diagnosis: Bilateral low back pain without sciatica - Plan: PT plan of care cert/re-cert  Muscle weakness (generalized) - Plan: PT plan of care cert/re-cert     Problem List Patient Active Problem List   Diagnosis Date Noted  . Postpartum care following vaginal delivery (12/20) 07/15/2015  . Gestational hypertension 07/14/2015  . Inappropriate sinus tachycardia (HCC) 06/05/2015    Eulis Fosterheryl Gray, PT 11/18/2015 11:02 AM    [facility] [address] [place], [place], [address] Phone: [phone]   Fax:  [phone]  Name: Tammie Ortiz MRN: [id number] Date of Birth: 07/08/1986

## 2015-11-21 ENCOUNTER — Encounter: Payer: Self-pay | Admitting: Physical Therapy

## 2015-11-21 ENCOUNTER — Ambulatory Visit: Payer: 59 | Admitting: Physical Therapy

## 2015-11-21 DIAGNOSIS — M545 Low back pain, unspecified: Secondary | ICD-10-CM

## 2015-11-21 DIAGNOSIS — M6281 Muscle weakness (generalized): Secondary | ICD-10-CM

## 2015-11-21 NOTE — Therapy (Signed)
[facility] [address] [place], [place], [address] Phone: [phone]   Fax:  [phone]  Physical Therapy Treatment  Patient Details  Name: Tammie Ortiz MRN: [id number] Date of Birth: Jun 01, 1986 Referring Provider: Carlos Levering, PA-C  Encounter Date: 11/21/2015      PT End of Session - 11/21/15 1112    Visit Number 2   Date for PT Re-Evaluation 01/13/16   PT Start Time 1105   PT Stop Time 1155   PT Time Calculation (min) 50 min   Activity Tolerance Patient limited by pain   Behavior During Therapy [facility] for tasks assessed/performed      Past Medical History  Diagnosis Date  . Gestational hypertension     Past Surgical History  Procedure Laterality Date  . Lymph node dissection      Left shoulder    There were no vitals filed for this visit.      Subjective Assessment - 11/21/15 1110    Subjective Pain still there. [place] made burning more intense.   Currently in Pain? Yes   Pain Score 2    Pain Location Back   Pain Descriptors / Indicators Dull;Sore   Multiple Pain Sites No                         [facility] Adult PT Treatment/Exercise - 11/21/15 0001    Lumbar Exercises: Stretches   Single Knee to Chest Stretch 2 reps;10 seconds  painful   Moist Heat Therapy   Number Minutes Moist Heat 15 Minutes   Moist Heat Location Lumbar Spine   Electrical Stimulation   Electrical Stimulation Location LT > RT lumbar   Electrical Stimulation Action IFC   Electrical Stimulation Parameters 80-150 HZ   Electrical Stimulation Goals Pain   Manual Therapy   Manual Therapy --  Attemtped to assess pelvic alignment.    Manual therapy comments Pt super tender at LT ASIS and could not palpate for accurate assessment. The LT ASIS did appear anterior. DId not attempt to corect with MET.                PT Education - 11/21/15 1121    Education provided Yes   Education Details TA series    Person(s) Educated Patient   Methods Explanation;Demonstration;Tactile cues;Verbal cues;Handout   Comprehension Verbalized understanding;Returned demonstration          PT Short Term Goals - 11/18/15 1055    PT SHORT TERM GOAL #1   Title independent with initial HEP   Time 4   Period Weeks   Status New   PT SHORT TERM GOAL #2   Title independent with correct body mechanics with care of her baby and household chores   Time 4   Period Weeks   Status New   PT SHORT TERM GOAL #3   Title pain with sitting decreased >/= 25%   Time 4   Period Weeks   Status New   PT SHORT TERM GOAL #4   Title pelvis in correct alignment so she is able to lift her baby with >/= 25%   Time 4   Period Weeks           PT Long Term Goals - 11/18/15 1057    PT LONG TERM GOAL #1   Title independent with HEP   Time 8   Period Weeks   Status New   PT LONG TERM GOAL #2  Title ability to perform facilitated work tasks with >/= 75% reduction in pain and proper body mechanics   Time 8   Period Weeks   Status New   PT LONG TERM GOAL #3   Title 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 She was able to perform TA contraction along with small ROm hip ER movements. This was given for HEP. The ultrasound reportedly made her pain worse so we tried Estim today in sidelying.    Rehab Potential Excellent   Clinical Impairments Affecting Rehab  Potential None   PT Frequency 2x / week   PT Duration 8 weeks   PT Treatment/Interventions Cryotherapy;Electrical Stimulation;Ultrasound;Traction;Iontophoresis 58m/ml Dexamethasone;Therapeutic activities;Therapeutic exercise;Patient/family education;Neuromuscular re-education;Manual techniques;Dry needling   PT Next Visit Plan See how Estim did, review TA exercises. Attempt to assess pelvis again and correct.    Consulted and Agree with Plan of Care Patient      Patient will benefit from skilled therapeutic intervention in order to improve the following deficits and impairments:  Pain, Increased fascial restricitons, Decreased mobility, Decreased coordination, Increased muscle spasms, Decreased activity tolerance, Decreased endurance, Decreased range of motion, Decreased strength, Impaired flexibility  Visit Diagnosis: Bilateral low back pain without sciatica  Muscle weakness (generalized)     Problem List Patient Active Problem List   Diagnosis Date Noted  . Postpartum care following vaginal delivery (12/20) 07/15/2015  . Gestational hypertension 07/14/2015  . [place] 06/05/2015    Penny Frisbie, PTA 11/21/2015, 11:48 AM  [facility] [address] [place] [address]: [phone]  Fax:  [phone] Name: Tammie BANKERTMRN: [id number] of Birth: 103-Nov-1987

## 2015-11-21 NOTE — Patient Instructions (Signed)

## 2015-11-27 ENCOUNTER — Ambulatory Visit: Payer: 59 | Attending: Family Medicine

## 2015-11-27 DIAGNOSIS — M545 Low back pain, unspecified: Secondary | ICD-10-CM

## 2015-11-27 DIAGNOSIS — M6281 Muscle weakness (generalized): Secondary | ICD-10-CM | POA: Insufficient documentation

## 2015-11-27 NOTE — Therapy (Signed)
[facility] [address], [address] [place], [place], [address] Phone: [phone]   Fax:  [phone]  Physical Therapy Treatment  Patient Details  Name: Tammie Ortiz MRN: [id number] Date of Birth: [birth year] Referring Provider: Carilyn Goodpasture, PA-C  Encounter Date: 11/27/2015      PT End of Session - 11/27/15 1526    Visit Number 3   Date for PT Re-Evaluation 01/13/16   PT Start Time 1447   PT Stop Time 1525   PT Time Calculation (min) 38 min   Activity Tolerance Patient limited by pain   Behavior During Therapy [facility] for tasks assessed/performed      Past Medical History  Diagnosis Date  . Gestational hypertension     Past Surgical History  Procedure Laterality Date  . Lymph node dissection      Left shoulder    There were no vitals filed for this visit.      Subjective Assessment - 11/27/15 1453    Subjective Pt reports that pain is about the same.  Didn't like e-stim.     Currently in Pain? Yes   Pain Score 2    Pain Location Back   Pain Orientation Lower;Right   Pain Descriptors / Indicators Sore;Dull   Pain Type Acute pain   Pain Onset More than a month ago   Aggravating Factors  sitting, bending forward, sit to stand   Pain Relieving Factors nothing helps                         [facility] Adult PT Treatment/Exercise - 11/27/15 0001    Exercises   Exercises Knee/Hip   Lumbar Exercises: Stretches   Active Hamstring Stretch 3 reps;20 seconds   Single Knee to Chest Stretch 2 reps;20 seconds   Lower Trunk Rotation 3 reps;20 seconds   Lumbar Exercises: Supine   Ab Set --  TA series review with bracing and leg drop outs.     Knee/Hip Exercises: Aerobic   Nustep Level 1 x 5 minutes   seat 8, arms10                PT Education - 11/27/15 1522    Education provided Yes   Education Details gentle flexibility, walking program   Person(s) Educated Patient   Methods  Explanation;Demonstration;Handout   Comprehension Verbalized understanding;Returned demonstration          PT Short Term Goals - 11/18/15 1055    PT SHORT TERM GOAL #1   Title independent with initial HEP   Time 4   Period Weeks   Status New   PT SHORT TERM GOAL #2   Title independent with correct body mechanics with care of her baby and household chores   Time 4   Period Weeks   Status New   PT SHORT TERM GOAL #3   Title pain with sitting decreased >/= 25%   Time 4   Period Weeks   Status New   PT SHORT TERM GOAL #4   Title pelvis in correct alignment so she is able to lift her baby with >/= 25%   Time 4   Period Weeks           PT Long Term Goals - 11/18/15 1057    PT LONG TERM GOAL #1   Title independent with HEP   Time 8   Period Weeks   Status New   PT LONG TERM GOAL #2  Title ability to perform facilitated work tasks with >/= 75% reduction in pain and proper body mechanics   Time 8   Period Weeks   Status New   PT LONG TERM GOAL #3   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  Pt with good demo of TA activation exercises.  Pt able to tolerate single knee to chest today.  Pt with painful and guarded movement that is continued.  Modalities didn't help over the past 2 sessions so not performed today . Pt is using body mechanics modifications for home tasks especially with lifting and caring for baby.  Pt will continue to benefit from skilled PT for flexiblity, strength  and manual/modalities as tolerated.   Rehab Potential Excellent   PT Frequency 2x / week   PT Duration 8 weeks   PT Treatment/Interventions Cryotherapy;Electrical Stimulation;Ultrasound;Traction;Iontophoresis 4mg /ml Dexamethasone;Therapeutic activities;Therapeutic exercise;Patient/family education;Neuromuscular re-education;Manual techniques;Dry needling   PT Next Visit Plan Core strength, flexibility, mobility, endurance as tolerated.  Manual and modalities as tolerated.  Review gentle stretches.  See how walking is going at home.     Consulted and Agree with Plan of Care Patient      Patient will benefit from skilled therapeutic intervention in order to improve the following deficits and impairments:  Pain, Increased fascial restricitons, Decreased mobility, Decreased coordination, Increased muscle spasms, Decreased activity tolerance, Decreased endurance, Decreased range of motion, Decreased strength, Impaired flexibility  Visit Diagnosis: Bilateral low back pain without sciatica  Muscle weakness (generalized)     Problem List Patient Active Problem List   Diagnosis Date Noted  . Postpartum care following vaginal delivery (12/20) 07/15/2015  . Gestational hypertension 07/14/2015  . Inappropriate sinus tachycardia (HCC) 06/05/2015     Lorrene ReidKelly Takacs, PT 11/27/2015 3:28 PM  [facility] [address], [address] [place], [place], [address] Phone: [phone]   Fax:  [phone]  Name: Marlowe AschoffKara C Derhammer MRN: [id number] Date of Birth: 05/09/1986

## 2015-11-27 NOTE — Patient Instructions (Signed)
Perform all exercises below:  Hold _20___ seconds. Repeat _3___ times.  Do __3__ sessions per day. CAUTION: Movement should be gentle, steady and slow.  Knee to Chest  Lying supine, bend involved knee to chest. Perform with each leg.   Lumbar Rotation: Caudal - Bilateral (Supine)  Feet and knees together, arms outstretched, rotate knees left, turning head in opposite direction, until stretch is felt.      HIP: Hamstrings - Short Sitting   Rest leg on raised surface. Keep knee straight. Lift chest.   [facility] [address], [address] [place], [place] [address] Phone # [phone] Fax [phone] 

## 2015-12-01 ENCOUNTER — Ambulatory Visit: Payer: 59 | Admitting: Physical Therapy

## 2015-12-03 ENCOUNTER — Ambulatory Visit: Payer: 59

## 2015-12-03 DIAGNOSIS — M545 Low back pain, unspecified: Secondary | ICD-10-CM

## 2015-12-03 DIAGNOSIS — M6281 Muscle weakness (generalized): Secondary | ICD-10-CM

## 2015-12-03 NOTE — Therapy (Signed)
[facility] [address] [place], [place], [address] Phone: [phone]   Fax:  [phone]  Physical Therapy Treatment  Patient Details  Name: Tammie Ortiz MRN: [id number] Date of Birth: [birth year] Referring Provider: Carilyn Goodpasture, PA-C  Encounter Date: 12/03/2015      PT End of Session - 12/03/15 1522    Visit Number 4   Date for PT Re-Evaluation 01/13/16   PT Start Time 1446   PT Stop Time 1524  limited endurance for activity and modalities dont help   PT Time Calculation (min) 38 min   Activity Tolerance Patient limited by pain   Behavior During Therapy [facility] for tasks assessed/performed      Past Medical History  Diagnosis Date  . Gestational hypertension     Past Surgical History  Procedure Laterality Date  . Lymph node dissection      Left shoulder    There were no vitals filed for this visit.      Subjective Assessment - 12/03/15 1451    Subjective Had a couple of bad days because husband was sick and she had to care for her baby more.     Currently in Pain? Yes   Pain Score 4    Pain Location Back   Pain Orientation Lower;Left   Pain Descriptors / Indicators Sore;Dull   Pain Type Acute pain   Pain Onset More than a month ago   Pain Frequency Intermittent   Aggravating Factors  sitting, bending forward, sit to stand, bathing child   Pain Relieving Factors nothing really, laying down                         [facility] Adult PT Treatment/Exercise - 12/03/15 0001    Lumbar Exercises: Stretches   Active Hamstring Stretch 3 reps;20 seconds   Single Knee to Chest Stretch 2 reps;20 seconds   Lower Trunk Rotation 3 reps;20 seconds   Lumbar Exercises: Aerobic   UBE (Upper Arm Bike) Level 1 x 3 minutes forward-spasm in back so ended early   Lumbar Exercises: Seated   Sit to Stand Limitations seated lumbar rotation 3x20 seconds   Knee/Hip Exercises: Aerobic   Nustep Level 1 x  8 minutes   seat 8, arms10                  PT Short Term Goals - 12/03/15 1452    PT SHORT TERM GOAL #1   Title independent with initial HEP   Time 4   Period Weeks   Status On-going   PT SHORT TERM GOAL #2   Title independent with correct body mechanics with care of her baby and household chores   Status Achieved   PT SHORT TERM GOAL #3   Title pain with sitting decreased >/= 25%   Time 4   Period Weeks   Status On-going           PT Long Term Goals - 11/18/15 1057    PT LONG TERM GOAL #1   Title independent with HEP   Time 8   Period Weeks   Status New   PT LONG TERM GOAL #2   Title ability to perform facilitated work tasks with >/= 75% reduction in pain and proper body mechanics   Time 8   Period Weeks   Status New   PT LONG TERM GOAL #3   Title ability 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  Pt reports increased ease with exercises and denies any change in pain with daily activities.  Pt is using body mechanics modifications for home tasks and care of baby.  Pt with continued guarded mobility and wll benefit from skilled PT for gentle mobiltiy, flexibility and core strength as tolerated.     Rehab Potential Excellent   PT Frequency 2x / week   PT Duration 8 weeks   PT Treatment/Interventions Cryotherapy;Electrical Stimulation;Ultrasound;Traction;Iontophoresis 4mg /ml Dexamethasone;Therapeutic  activities;Therapeutic exercise;Patient/family education;Neuromuscular re-education;Manual techniques;Dry needling   PT Next Visit Plan Core strength, flexibility, mobility, endurance as tolerated.  Manual and modalities as tolerated.  Review gentle stretches.       Patient will benefit from skilled therapeutic intervention in order to improve the following deficits and impairments:  Pain, Increased fascial restricitons, Decreased mobility, Decreased coordination, Increased muscle spasms, Decreased activity tolerance, Decreased endurance, Decreased range of motion, Decreased strength, Impaired flexibility  Visit Diagnosis: Bilateral low back pain without sciatica  Muscle weakness (generalized)     Problem List Patient Active Problem List   Diagnosis Date Noted  . Postpartum care following vaginal delivery (12/20) 07/15/2015  . Gestational hypertension 07/14/2015  . Inappropriate sinus tachycardia (HCC) 06/05/2015     Lorrene ReidKelly Takacs, PT 12/03/2015 3:28 PM  [facility] [address] [place], [place], [address] Phone: [phone]   Fax:  [phone]  Name: Tammie Ortiz MRN: [id number] Date of Birth: 01/30/1986

## 2015-12-08 ENCOUNTER — Encounter: Payer: Self-pay | Admitting: Physical Therapy

## 2015-12-11 ENCOUNTER — Ambulatory Visit: Payer: 59

## 2015-12-11 DIAGNOSIS — M545 Low back pain, unspecified: Secondary | ICD-10-CM

## 2015-12-11 DIAGNOSIS — M6281 Muscle weakness (generalized): Secondary | ICD-10-CM

## 2015-12-11 NOTE — Therapy (Signed)
[facility] [address] [place], [place], [address] Phone: [phone]   Fax:  [phone]  Physical Therapy Treatment  Patient Details  Name: Tammie AschoffKara C Whitecotton MRN: [id number] Date of Birth: 07/29/1985 Referring Provider: Carilyn GoodpastureJennifer Willard, PA-C  Encounter Date: 12/11/2015      PT End of Session - 12/11/15 1528    Visit Number 5   Date for PT Re-Evaluation 01/13/16   PT Start Time 1445   PT Stop Time 1529   PT Time Calculation (min) 44 min   Activity Tolerance Patient tolerated treatment well   Behavior During Therapy [facility] for tasks assessed/performed      Past Medical History  Diagnosis Date  . Gestational hypertension     Past Surgical History  Procedure Laterality Date  . Lymph node dissection      Left shoulder    There were no vitals filed for this visit.      Subjective Assessment - 12/11/15 1449    Subjective Pt reports increased pain today. Had a spasm with reaching for her phone.     Currently in Pain? Yes   Pain Score 5    Pain Location Back   Pain Orientation Left;Lower;Right   Pain Descriptors / Indicators Sore;Dull;Tightness;Shooting   Pain Type Acute pain   Pain Onset More than a month ago   Pain Frequency Intermittent   Aggravating Factors  reaching, sitting, sit to stand, bathing child   Pain Relieving Factors nothing really                         [facility] Adult PT Treatment/Exercise - 12/11/15 0001    Lumbar Exercises: Stretches   Single Knee to Chest Stretch 2 reps;20 seconds   Lumbar Exercises: Aerobic   UBE (Upper Arm Bike) Level 1 x 6 min- forward   Lumbar Exercises: Standing   Row Strengthening;Power tower;Both;20 reps   Row Limitations 15#   Lumbar Exercises: Seated   Sit to Stand Limitations seated lumbar rotation 3x20 seconds   Lumbar Exercises: Supine   Other Supine Lumbar Exercises abdominal bracing yellow band: horizontal abduction 2x10   Lumbar  Exercises: Quadruped   Madcat/Old Horse 5 reps  too much pain with arching   Knee/Hip Exercises: Aerobic   Nustep Level 1 x 10  minutes   seat 8, arms10                  PT Short Term Goals - 12/11/15 1451    PT SHORT TERM GOAL #1   Title independent with initial HEP   Status Achieved   PT SHORT TERM GOAL #2   Title independent with correct body mechanics with care of her baby and household chores   Status Achieved   PT SHORT TERM GOAL #3   Title pain with sitting decreased >/= 25%   Time 4   Period Weeks   Status On-going           PT Long Term Goals - 11/18/15 1057    PT LONG TERM GOAL #1   Title independent with HEP   Time 8   Period Weeks   Status New   PT LONG TERM GOAL #2   Title ability to perform facilitated work tasks with >/= 75% reduction in pain and proper body mechanics   Time 8   Period Weeks   Status New   PT LONG TERM GOAL #3   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  Pt with guarded movement with all activity.  Pt denies any change in the intensity of her pain.  Frequency has reduced very minimally per pt report.  Pt able to tolerate supine strength with resistance band today and standing rows.  Pt will continue to benefit from skilled PT to advance mobility and strength for return to care of child and household tasks without modification or limitation.     Rehab Potential Excellent   PT Frequency 2x / week   PT Duration 8  weeks   PT Treatment/Interventions Cryotherapy;Electrical Stimulation;Ultrasound;Traction;Iontophoresis /ml Dexamethasone;Therapeutic activities;Therapeutic exercise;Patient/family education;Neuromuscular re-education;Manual techniques;Dry needling   PT Next Visit Plan Core strength, flexibility, mobility, endurance as tolerated.  Manual and modalities as tolerated.  Try walking in reverse with weight (15#)   Consulted and Agree with Plan of Care Patient      Patient will benefit from skilled therapeutic intervention in order to improve the following deficits and impairments:  Pain, Increased fascial restricitons, Decreased mobility, Decreased coordination, Increased muscle spasms, Decreased activity tolerance, Decreased endurance, Decreased range of motion, Decreased strength, Impaired flexibility  Visit Diagnosis: Bilateral low back pain without sciatica  Muscle weakness (generalized)     Problem List Patient Active Problem List   Diagnosis Date Noted  . Postpartum care following vaginal delivery (12/20) 07/15/2015  . Gestational hypertension 07/14/2015  . Inappropriate sinus tachycardia (HCC) 06/05/2015     Tammie Ortiz, PT 12/11/2015 3:30 PM  [facility] [address], [address] [place], [place], [address] Phone: [phone]   Fax:  [phone]  Name: Tammie Ortiz MRN: [id number] Date of Birth: Jan 26, 1986

## 2016-01-07 ENCOUNTER — Ambulatory Visit: Payer: BLUE CROSS/BLUE SHIELD | Attending: Family Medicine

## 2016-01-07 DIAGNOSIS — M545 Low back pain, unspecified: Secondary | ICD-10-CM

## 2016-01-07 DIAGNOSIS — M6281 Muscle weakness (generalized): Secondary | ICD-10-CM | POA: Insufficient documentation

## 2016-01-07 NOTE — Therapy (Signed)
[facility] [address], [address] [place], [place], [address] Phone: [phone]   Fax:  [phone]  Physical Therapy Treatment  Patient Details  Name: Tammie Ortiz MRN: [id number] Date of Birth: [birth year] Referring Provider: Mila Palmer, MD  Encounter Date: 01/07/2016      PT End of Session - 01/07/16 1609    Visit Number 6   Date for PT Re-Evaluation 03/03/16   PT Start Time 1535   PT Stop Time 1619   PT Time Calculation (min) 44 min   Activity Tolerance Patient tolerated treatment well   Behavior During Therapy [facility] for tasks assessed/performed      Past Medical History  Diagnosis Date  . Gestational hypertension     Past Surgical History  Procedure Laterality Date  . Lymph node dissection      Left shoulder    There were no vitals filed for this visit.      Subjective Assessment - 01/07/16 1549    Subjective Pt with lapse in treatment due to change in insurance.  Pt reports 20% overall improvement in symptoms since the start of care.   Currently in Pain? 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, dispensing, bending, climibing   Cognition   Overall Cognitive Status Within Functional Limits for tasks assessed   Observation/Other Assessments   Focus on Therapeutic Outcomes (FOTO)  41%  limitation   Posture/Postural Control   Posture/Postural Control No significant limitations   ROM / Strength   AROM / PROM / Strength AROM;Strength   AROM   AROM Assessment Site Lumbar   Lumbar Flexion full   Lumbar Extension full   Lumbar - Right Side Bend full   Lumbar - Left Side Bend decresaed by 25% due to pain   Strength   Right Hip Extension 4/5   Right Hip ABduction 4+/5   Left Hip Extension 4/5   Left Hip ABduction 4+/5                     [facility] Adult PT Treatment/Exercise - 01/07/16 0001    Lumbar Exercises: Stretches   Active Hamstring Stretch 3 reps;20 seconds  supine with strap   Lumbar Exercises: Aerobic   UBE (Upper Arm Bike) Level 1 x 6 min- 3/3   Lumbar Exercises: Standing   Row Strengthening;Power tower;Both;20 reps   Row Limitations 15#   Other Standing Lumbar Exercises walking in reverse: 20# 2x10 with abdominal bracing   Lumbar Exercises: Supine   Bridge 20 reps;5 seconds   Lumbar Exercises: Quadruped   Madcat/Old Horse 10 reps  too much pain with arching   Knee/Hip Exercises: Aerobic   Nustep Level 1 x  6  minutes   seat 8, arms10  PT Education - 01/07/16 1608    Education provided Yes   Education Details bridge, cat/cow   Person(s) Educated Patient   Methods Explanation;Demonstration;Handout   Comprehension Verbalized understanding;Returned demonstration          PT Short Term Goals - 01/07/16 1541    PT SHORT TERM GOAL #3   Title pain with sitting decreased >/= 25%   Time 4   Period Weeks   Status On-going  20% better   PT SHORT TERM GOAL #4   Title pelvis in correct alignment so she is able to lift her baby with >/= 25%           PT Long Term Goals - 01/07/16 1542    PT LONG TERM GOAL #1   Title independent with HEP   Time 8   Period Weeks   Status On-going   PT LONG TERM GOAL #2   Title ability to perform facilitated work tasks with >/= 75% reduction in pain and proper body mechanics    Status Deferred   PT LONG TERM GOAL #3   Title ability to sit for 30-45 minutes with pain decreased >/= 75%   Status Achieved   PT LONG TERM GOAL #4   Title lift >/= 30 pounds with correct body mechanics so she is able to lift her baby with carrier and lift at work   Time 8   Period Weeks   Status On-going   PT LONG TERM GOAL #5   Title FOTO score is > or =29% limitation   Time 8   Period Weeks   Status On-going  41% limitation               Plan - 01/07/16 1551    Clinical Impression Statement Pt with lapse in treatment due to change in insurance.  Pt reports 20% overall improvement since the start of care.  Pt reports 3/10 intermittent LBP and describes the pain as spasm.  Pt demonstrates full lumbar AROM except Lt sidebending is limited by 25% due to pain.  FOTO score is improve to 41% limitation (51% at evaluation).  Pt is still not able to lift and carry her baby in baby carrier.  Pt with improved mobility and ability to exercise in the clinic today.  Pt will continue to benefit form skilled PT for strength, mobility and endurance tasks.    Rehab Potential Good   PT Frequency 2x / week   PT Duration 8 weeks   PT Treatment/Interventions Cryotherapy;Electrical Stimulation;Ultrasound;Traction;Iontophoresis 4mg /ml Dexamethasone;Therapeutic activities;Therapeutic exercise;Patient/family education;Neuromuscular re-education;Manual techniques;Dry needling   PT Next Visit Plan Core strength, flexibility, mobility, endurance as tolerated.  Manual and modalities as tolerated.  Try walking in reverse with weight (15#)      Patient will benefit from skilled therapeutic intervention in order to improve the following deficits and impairments:  Pain, Increased fascial restricitons, Decreased mobility, Decreased coordination, Increased muscle spasms, Decreased activity tolerance, Decreased endurance, Decreased range of motion, Decreased strength, Impaired flexibility  Visit  Diagnosis: Bilateral low back pain without sciatica - Plan: PT plan of care cert/re-cert  Muscle weakness (generalized) - Plan: PT plan of care cert/re-cert     Problem List Patient Active Problem List   Diagnosis Date Noted  . Postpartum care following vaginal delivery (12/20) 07/15/2015  . Gestational hypertension 07/14/2015  . Inappropriate sinus tachycardia (HCC) 06/05/2015     Lorrene ReidKelly Tashianna Broome, PT 01/07/2016 4:16 PM  [facility] [address], [address]  [place], [place], [address] Phone: [phone]   Fax:  [phone]  Name: Tammie Ortiz MRN: [id number] Date of Birth: 05/01/1986

## 2016-01-07 NOTE — Patient Instructions (Signed)
Bridge    Lie back, legs bent. Inhale, pressing hips up. Keeping ribs in, lengthen lower back. Exhale, rolling down along spine from top.  Hold 5 seconds Repeat _2x10___ times. Do __2__ sessions per day.  http://pm.exer.us/55   Copyright  VHI. All rights reserved.  Cat / Cow Flow    Inhale, press spine toward ceiling like a Halloween cat. Keeping strength in arms and abdominals, exhale to soften spine through neutral and into cow pose. Open chest and arch back. Initiate movement between cat and cow at tailbone, one vertebrae at a time. Repeat _5-10___ times.  Copyright  VHI. All rights reserved.  [facility] [address], [address] [place], [place] [address] Phone # [phone] Fax [phone]

## 2016-01-13 ENCOUNTER — Ambulatory Visit: Payer: BLUE CROSS/BLUE SHIELD

## 2016-01-13 DIAGNOSIS — M545 Low back pain, unspecified: Secondary | ICD-10-CM

## 2016-01-13 DIAGNOSIS — M6281 Muscle weakness (generalized): Secondary | ICD-10-CM

## 2016-01-13 NOTE — Therapy (Signed)
[facility] [address], [address] [place], [place], [address] Phone: [phone]   Fax:  [phone]  Physical Therapy Treatment  Patient Details  Name: Tammie Ortiz MRN: [id number] Date of Birth: 09/26/1985 Referring Provider: Mila PalmerWolters, Sharon, MD  Encounter Date: 01/13/2016      PT End of Session - 01/13/16 1611    Visit Number 7   Date for PT Re-Evaluation 03/03/16   PT Start Time 1531   PT Stop Time 1618   PT Time Calculation (min) 47 min   Activity Tolerance Patient tolerated treatment well   Behavior During Therapy [facility] for tasks assessed/performed      Past Medical History  Diagnosis Date  . Gestational hypertension     Past Surgical History  Procedure Laterality Date  . Lymph node dissection      Left shoulder    There were no vitals filed for this visit.      Subjective Assessment - 01/13/16 1534    Subjective Doing fine.  Had to do a lot of cleaning at home yesterday.     Currently in Pain? Yes   Pain Score 4    Pain Location Back   Pain Orientation Right;Lower   Pain Descriptors / Indicators Sore;Spasm;Tightness   Pain Type Acute pain   Pain Onset More than a month ago   Pain Frequency Intermittent   Aggravating Factors  lifting, housework, reaching   Pain Relieving Factors rubbing, ice/heat                         [facility] Adult PT Treatment/Exercise - 01/13/16 0001    Lumbar Exercises: Stretches   Active Hamstring Stretch 3 reps;20 seconds  supine with strap   Lumbar Exercises: Aerobic   UBE (Upper Arm Bike) Level 1 x 6 min- 3/3   Lumbar Exercises: Standing   Row Strengthening;Power tower;Both  3x10   Row Limitations 20#   Other Standing Lumbar Exercises walking in reverse: 20# 2x10 with abdominal bracing   Lumbar Exercises: Seated   Sit to Stand Limitations seated on green ball: 3 way raises with 1# weight   Lumbar Exercises: Supine   Other Supine Lumbar Exercises  supine on foam roll for decompression x 3 minutes- pt experienced a spasm with this, abdominal bracing yellow band: horizontal abduction 2x10   Knee/Hip Exercises: Aerobic   Nustep Level 1 x  6  minutes   seat 8, arms10                  PT Short Term Goals - 01/13/16 1535    PT SHORT TERM GOAL #3   Title pain with sitting decreased >/= 25%   Time 4   Period Weeks   Status On-going   PT SHORT TERM GOAL #4   Title pelvis in correct alignment so she is able to lift her baby with >/= 25%   Time 4   Period Weeks   Status On-going           PT Long Term Goals - 01/07/16 1542    PT LONG TERM GOAL #1   Title independent with HEP   Time 8   Period Weeks   Status On-going   PT LONG TERM GOAL #2   Title ability to perform facilitated work tasks with >/= 75% reduction in pain and proper body mechanics   Status Deferred   PT LONG TERM GOAL #3   Title ability to  sit for 30-45 minutes with pain decreased >/= 75%   Status Achieved   PT LONG TERM GOAL #4   Title lift >/= 30 pounds with correct body mechanics so she is able to lift her baby with carrier and lift at work   Time 8   Period Weeks   Status On-going   PT LONG TERM GOAL #5   Title FOTO score is > or =29% limitation   Time 8   Period Weeks   Status On-going  41% limitation               Plan - 01/13/16 1535    Clinical Impression Statement Pt reports 20% overall improvement in LBP since the start of care.  Pt with 4/10 LBP today due to doing housework over the past few days.  Pt reports that she is having intermittent spasms in the low back espeically with trying to carry her baby. Pt experienced an intense spasm on Lt side of the back with decompression on foam roll during treatment today.   Pt is still not able to lift and carry her baby in the car seat carrier and is only able to hold her for limited periods of time.  Pt is able to tolerate increased exercise in the clinic and reports that she feels  better after each session.  Pt will continue to beneift from skilled PT for strength, mobility and endurance tasks.     Rehab Potential Good   PT Frequency 2x / week   PT Duration 8 weeks   PT Treatment/Interventions Cryotherapy;Electrical Stimulation;Ultrasound;Traction;Iontophoresis /ml Dexamethasone;Therapeutic activities;Therapeutic exercise;Patient/family education;Neuromuscular re-education;Manual techniques;Dry needling   PT Next Visit Plan Core strength, flexibility, mobility, endurance as tolerated.  Manual and modalities as tolerated.    Consulted and Agree with Plan of Care Patient      Patient will benefit from skilled therapeutic intervention in order to improve the following deficits and impairments:  Pain, Increased fascial restricitons, Decreased mobility, Decreased coordination, Increased muscle spasms, Decreased activity tolerance, Decreased endurance, Decreased range of motion, Decreased strength, Impaired flexibility  Visit Diagnosis: Bilateral low back pain without sciatica  Muscle weakness (generalized)     Problem List Patient Active Problem List   Diagnosis Date Noted  . Postpartum care following vaginal delivery (12/20) 07/15/2015  . Gestational hypertension 07/14/2015  . Inappropriate sinus tachycardia (HCC) 06/05/2015   Lorrene Reid, PT 01/13/2016 4:12 PM  [facility] [address] [place], [place], [address] Phone: [phone]   Fax:  [phone]  Name: Tammie Ortiz MRN: [id number] Date of Birth: 31-May-1986

## 2016-01-15 ENCOUNTER — Encounter: Payer: Self-pay | Admitting: Physical Therapy

## 2016-01-21 ENCOUNTER — Encounter: Payer: Self-pay | Admitting: Physical Therapy

## 2016-02-02 ENCOUNTER — Encounter: Payer: Self-pay | Admitting: Physical Therapy

## 2016-02-02 ENCOUNTER — Ambulatory Visit: Payer: BLUE CROSS/BLUE SHIELD | Attending: Family Medicine | Admitting: Physical Therapy

## 2016-02-02 DIAGNOSIS — M6281 Muscle weakness (generalized): Secondary | ICD-10-CM | POA: Diagnosis present

## 2016-02-02 DIAGNOSIS — M545 Low back pain, unspecified: Secondary | ICD-10-CM

## 2016-02-02 NOTE — Therapy (Signed)
[facility] [address], [address] [place], [place], [address] Phone: [phone]   Fax:  [phone]  Physical Therapy Treatment  Patient Details  Name: Tammie AschoffKara C Bergsma MRN: [id number] Date of Birth: 08/15/1985 Referring Provider: Mila PalmerWolters, Sharon, MD  Encounter Date: 02/02/2016      PT End of Session - 02/02/16 1448    Visit Number 8   Date for PT Re-Evaluation 03/03/16   PT Start Time 1445   PT Stop Time 1523   PT Time Calculation (min) 38 min   Activity Tolerance Patient tolerated treatment well   Behavior During Therapy [facility] for tasks assessed/performed      Past Medical History  Diagnosis Date  . Gestational hypertension     Past Surgical History  Procedure Laterality Date  . Lymph node dissection      Left shoulder    There were no vitals filed for this visit.      Subjective Assessment - 02/02/16 1450    Subjective Doing well today, no pain.    Currently in Pain? No/denies                         [facility] Adult PT Treatment/Exercise - 02/02/16 0001    Lumbar Exercises: Stretches   Active Hamstring Stretch 3 reps;20 seconds  supine with strap   Single Knee to Chest Stretch 1 rep;60 seconds  this released spam on the LT.   Lumbar Exercises: Aerobic   UBE (Upper Arm Bike) Level 1 x 6 min- 3/3   Lumbar Exercises: Standing   Row Strengthening;Power tower;Both  3x10   Row Limitations 20#   Other Standing Lumbar Exercises walking in reverse: 25# 2x10 with abdominal bracing   Lumbar Exercises: Seated   Sit to Stand Limitations seated on green ball: 3 way raises with 1# weight  10x with core activated   Lumbar Exercises: Supine   Other Supine Lumbar Exercises supine on foam roll for decompression x 3 minutes- pt experienced a spasm with this, abdominal bracing yellow band: horizontal abduction 3x10   Knee/Hip Exercises: Aerobic   Nustep L2 x 6 min                  PT Short  Term Goals - 02/02/16 1458    PT SHORT TERM GOAL #3   Title pain with sitting decreased >/= 25%   Time 4   Period Weeks   Status Achieved  30%           PT Long Term Goals - 01/07/16 1542    PT LONG TERM GOAL #1   Title independent with HEP   Time 8   Period Weeks   Status On-going   PT LONG TERM GOAL #2   Title ability to perform facilitated work tasks with >/= 75% reduction in pain and proper body mechanics   Status Deferred   PT LONG TERM GOAL #3   Title ability to sit for 30-45 minutes with pain decreased >/= 75%   Status Achieved   PT LONG TERM GOAL #4   Title lift >/= 30 pounds with correct body mechanics so she is able to lift her baby with carrier and lift at work   Time 8   Period Weeks   Status On-going   PT LONG TERM GOAL #5   Title FOTO score is > or =29% limitation   Time 8   Period Weeks   Status On-going  41%  limitation               Plan - 02/02/16 1450    Clinical Impression Statement Sitting pain is 30% improved per pt report meeting [facility]. The same spasm occured in the LT low back today while on the soft foam roll. This took a few minutes to abolish. Only holds baby when sitting, and continues to use  help with carrying and holding baby.    Rehab Potential Good   Clinical Impairments Affecting Rehab Potential None   PT Frequency 2x / week   PT Duration 8 weeks   PT Treatment/Interventions Cryotherapy;Electrical Stimulation;Ultrasound;Traction;Iontophoresis /ml Dexamethasone;Therapeutic activities;Therapeutic exercise;Patient/family education;Neuromuscular re-education;Manual techniques;Dry needling   PT Next Visit Plan Core strength, flexibility, mobility, endurance as tolerated.  Manual and modalities as tolerated. Probably no foam roll laying.    Consulted and Agree with Plan of Care Patient      Patient will benefit from skilled therapeutic intervention in order to improve the following deficits and impairments:  Pain, Increased fascial  restricitons, Decreased mobility, Decreased coordination, Increased muscle spasms, Decreased activity tolerance, Decreased endurance, Decreased range of motion, Decreased strength, Impaired flexibility  Visit Diagnosis: Bilateral low back pain without sciatica  Muscle weakness (generalized)     Problem List Patient Active Problem List   Diagnosis Date Noted  . Postpartum care following vaginal delivery (12/20) 07/15/2015  . Gestational hypertension 07/14/2015  . Inappropriate sinus tachycardia (HCC) 06/05/2015    Jaritza Duignan, PTA 02/02/2016, 3:17 PM  [facility] [address], [address] [place], [place], [address] Phone: [phone]   Fax:  [phone]  Name: AZARIYAH LUHRS MRN: [id number] Date of Birth: [birth year]

## 2016-02-16 ENCOUNTER — Encounter: Payer: Self-pay | Admitting: Physical Therapy

## 2016-03-01 ENCOUNTER — Encounter: Payer: Self-pay | Admitting: Physical Therapy

## 2016-03-01 ENCOUNTER — Ambulatory Visit: Payer: BLUE CROSS/BLUE SHIELD | Attending: Family Medicine | Admitting: Physical Therapy

## 2016-03-01 DIAGNOSIS — M545 Low back pain, unspecified: Secondary | ICD-10-CM

## 2016-03-01 DIAGNOSIS — M6281 Muscle weakness (generalized): Secondary | ICD-10-CM | POA: Insufficient documentation

## 2016-03-01 NOTE — Therapy (Addendum)
[facility] [address], [place], [place], [address] Phone: [phone]   Fax:  [phone]  Physical Therapy Treatment  Patient Details  Name: Tammie Ortiz MRN: [id number] Date of Birth: 03-23-1986 Referring Provider: Jonathon Jordan, MD  Encounter Date: 03/01/2016      PT End of Session - 03/01/16 1233    Visit Number 9   Date for PT Re-Evaluation 04/26/16   PT Start Time 3382   PT Stop Time 1310   PT Time Calculation (min) 39 min   Activity Tolerance Patient tolerated treatment well   Behavior During Therapy [facility] for tasks assessed/performed      Past Medical History:  Diagnosis Date  . Gestational hypertension     Past Surgical History:  Procedure Laterality Date  . LYMPH NODE DISSECTION     Left shoulder    There were no vitals filed for this visit.      Subjective Assessment - 03/01/16 1232    Subjective I still have spasms but they go away faster and they are not as frequent. I would like to continue 1x week everyother week to work on my lifting.  Still not lifting much.    Currently in Pain? Yes   Pain Score 2    Pain Location Back   Pain Orientation Right;Lower   Pain Descriptors / Indicators Dull   Aggravating Factors  Lifting, housework   Pain Relieving Factors Stretches   Multiple Pain Sites No            [facility] PT Assessment - 03/01/16 0001      Assessment   Medical Diagnosis M54.9 Dorsalgia, unspecified   Referring Provider Jonathon Jordan, MD   Onset Date/Surgical Date 10/29/15     Precautions   Precautions Back   Precaution Comments None per last MD     Observation/Other Assessments   Focus on Therapeutic Outcomes (FOTO)  37% limited CJ     Posture/Postural Control   Posture/Postural Control No significant limitations     AROM   AROM Assessment Site Lumbar   Lumbar Flexion full   Lumbar Extension full   Lumbar - Right Side Bend full   Lumbar - Left Side Bend  WNL     Strength   Right Hip Extension 4/5   Right Hip ABduction 4/5  Painful laying on her LT   Left Hip Extension 4-/5   Left Hip ABduction --  Pt could not do in SL position. She kept cramping in glute                      [facility] Adult PT Treatment/Exercise - 03/01/16 0001      Lumbar Exercises: Stretches   Active Hamstring Stretch 3 reps;20 seconds  supine with strap   Single Knee to Chest Stretch 1 rep;3 reps;20 seconds  this released spam on the LT.     Lumbar Exercises: Aerobic   UBE (Upper Arm Bike) L1 x 8 min     Lumbar Exercises: Supine   Ab Set --  Cocontraction of glutes/adductors/TA 10x   Bridge 10 reps;3 seconds  with ball squeeze                  PT Short Term Goals - 03/01/16 1256      PT SHORT TERM GOAL #1   Title independent with initial HEP   Time 4   Period Weeks   Status Achieved     PT  SHORT TERM GOAL #2   Title independent with correct body mechanics with care of her baby and household chores   Time 4   Period Weeks   Status Achieved     PT SHORT TERM GOAL #3   Title pain with sitting decreased >/= 25%   Time 4   Period Weeks   Status Achieved     PT SHORT TERM GOAL #4   Title pelvis in correct alignment so she is able to lift her baby with >/= 25%   Time 4   Period Weeks   Status Achieved  Pelvic in alignment today           PT Long Term Goals - 03/01/16 1234      PT LONG TERM GOAL #4   Title lift >/= 30 pounds with correct body mechanics so she is able to lift her baby with carrier and lift at work   Time 8   Period Weeks   Status On-going               Plan - 03/01/16 1300    Clinical Impression Statement Spasms are 20%-30% less frequent and they go away faster than they used to . Pt still does not carry baby much or her carrier, but she has been able to lift lighter things  around the house like dishes.     Rehab Potential Good   Clinical Impairments Affecting Rehab Potential None   PT  Frequency 1x / week   PT Duration 8 weeks   PT Treatment/Interventions Cryotherapy;Electrical Stimulation;Ultrasound;Traction;Iontophoresis 28m/ml Dexamethasone;Therapeutic activities;Therapeutic exercise;Patient/family education;Neuromuscular re-education;Manual techniques;Dry needling   PT Next Visit Plan Pt would like to continue PT to work on her lifting.    Consulted and Agree with Plan of Care Patient      Patient will benefit from skilled therapeutic intervention in order to improve the following deficits and impairments:  Pain, Increased fascial restricitons, Decreased mobility, Decreased coordination, Increased muscle spasms, Decreased activity tolerance, Decreased endurance, Decreased range of motion, Decreased strength, Impaired flexibility  Visit Diagnosis: Bilateral low back pain without sciatica - Plan: PT plan of care cert/re-cert  Muscle weakness (generalized) - Plan: PT plan of care cert/re-cert     Problem List Patient Active Problem List   Diagnosis Date Noted  . Postpartum care following vaginal delivery (12/20) 07/15/2015  . Gestational hypertension 07/14/2015  . Inappropriate [place] tachycardia ([facility] 06/05/2015     Tammie Ortiz PTA 03/01/16 4:25 PM PHYSICAL THERAPY DISCHARGE SUMMARY  Visits from Start of Care: 9  Current functional level related to goals / functional outcomes: Pt didn't return to PT after visit on 03/01/16.  No appointments scheduled.     Remaining deficits: Unknown current status as pt didn't return to PT.  Pt has HEP in place.     Education / Equipment: Body mechanics, HEP Plan: Patient agrees to discharge.  Patient goals were partially met. Patient is being discharged due to not returning since the last visit.  ?????        KSigurd Sos PT 04/07/16 2:50 PM   [facility] [address] [place] [address]Phone: [phone]  Fax:   [phone] Name: Tammie CHARONMRN: [id number] of Birth: [birth year]

## 2017-10-21 IMAGING — DX DG HAND COMPLETE 3+V*R*
3 series · 3 of 3 positions shown · non-contrast
Comparison: None.

CLINICAL DATA: Pain and swelling of the right fifth finger after
injury.

EXAM:
RIGHT HAND - COMPLETE 3+ VIEW

[hand pa]
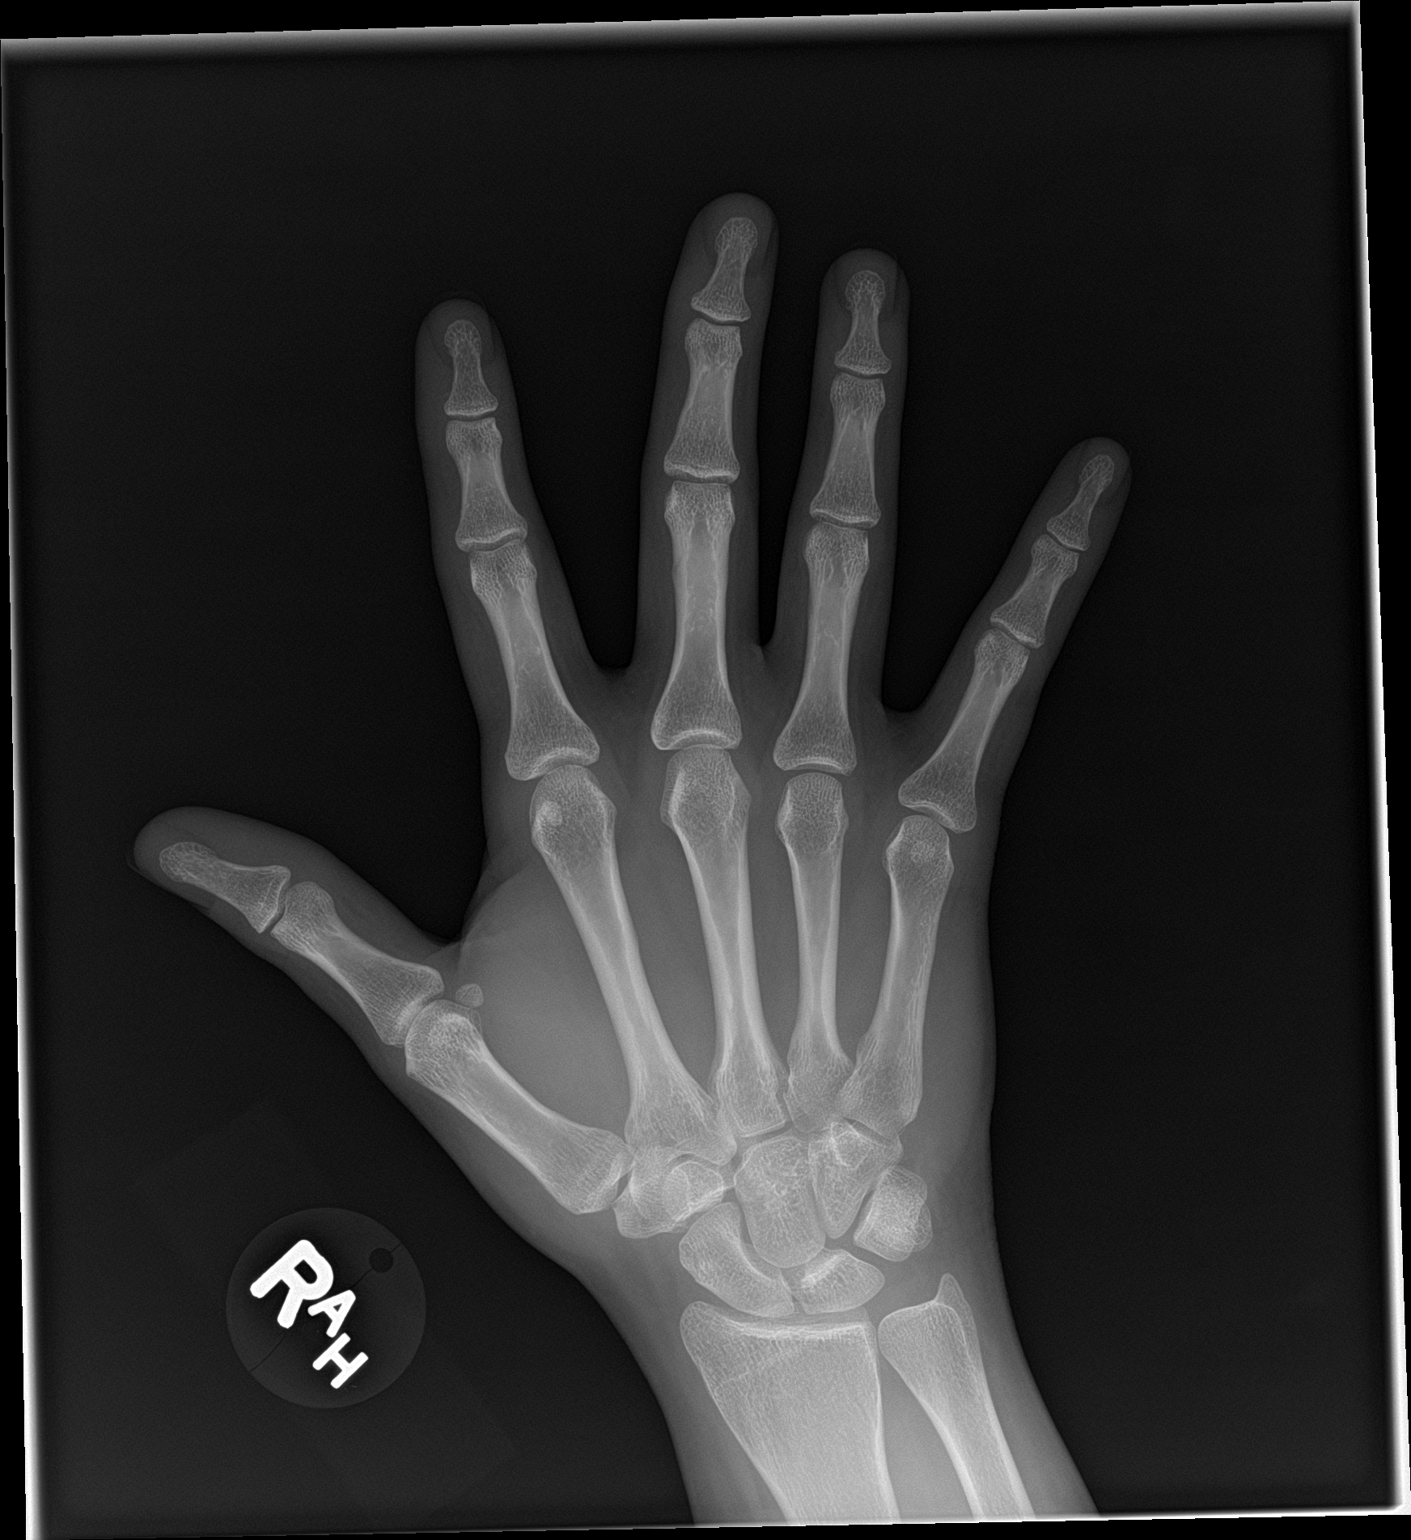

[hand obl]
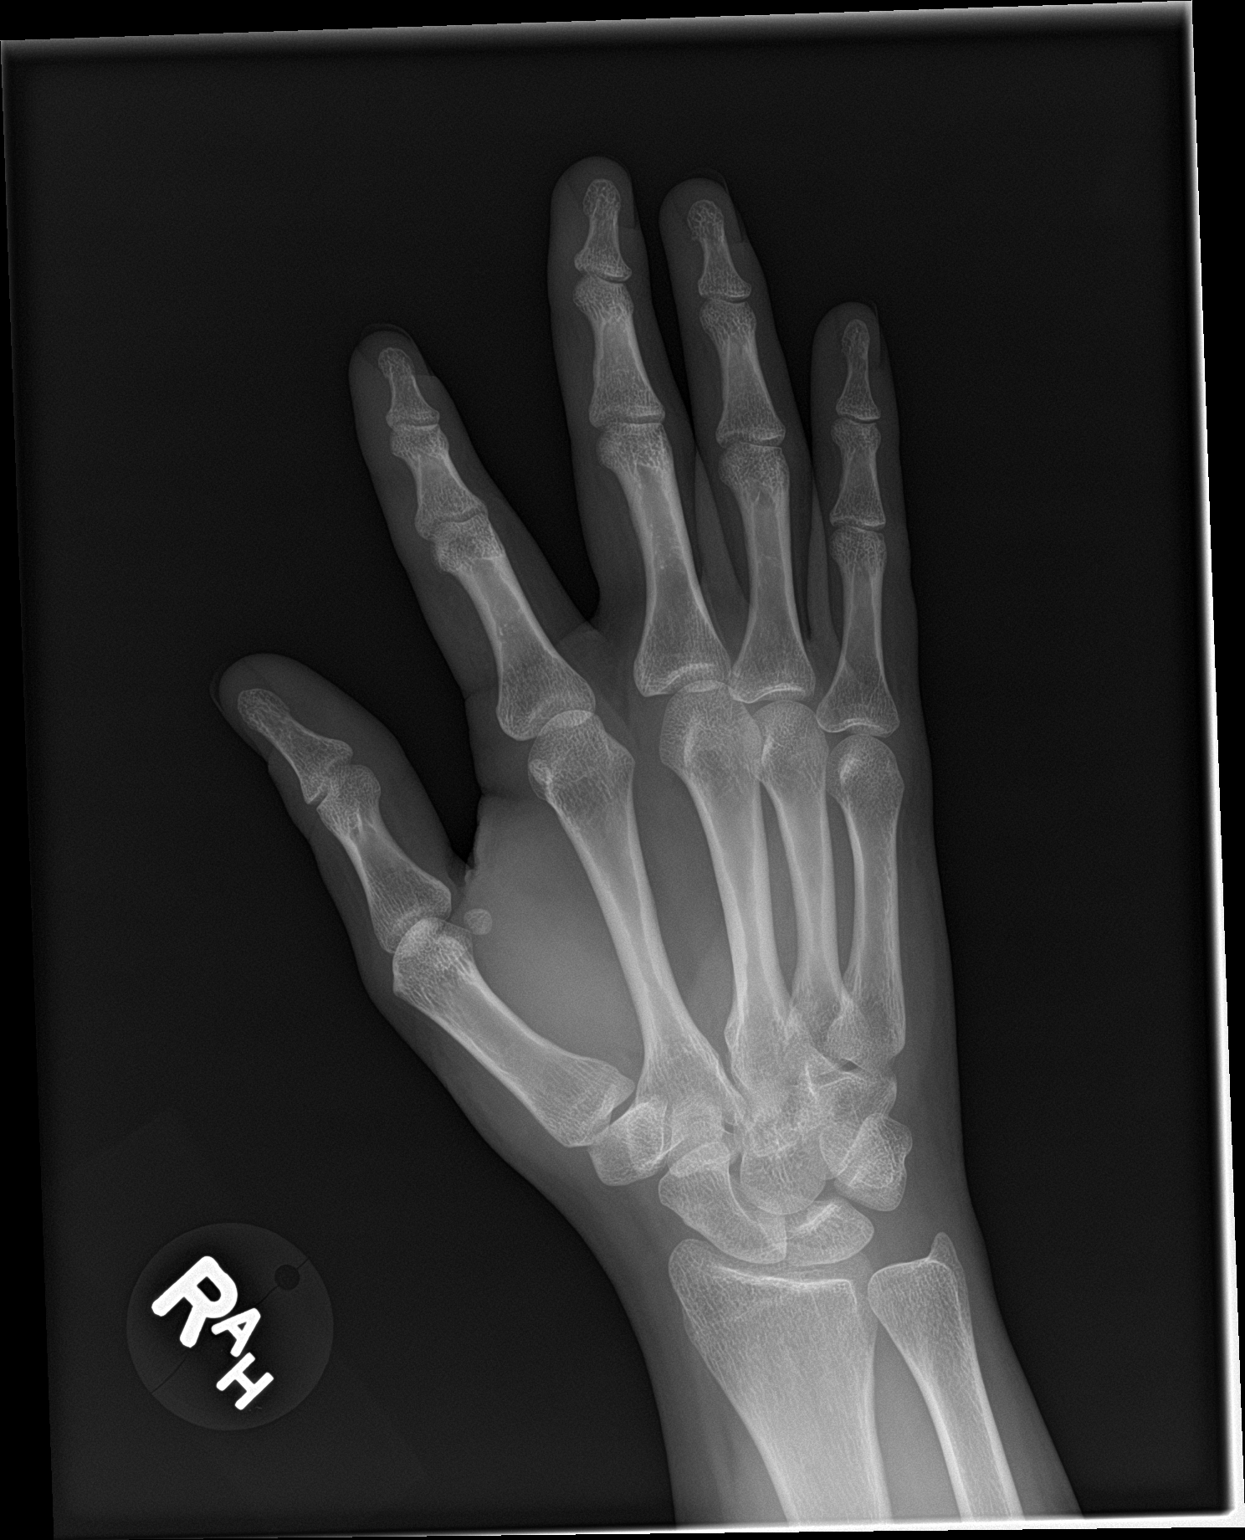

[hand lat]
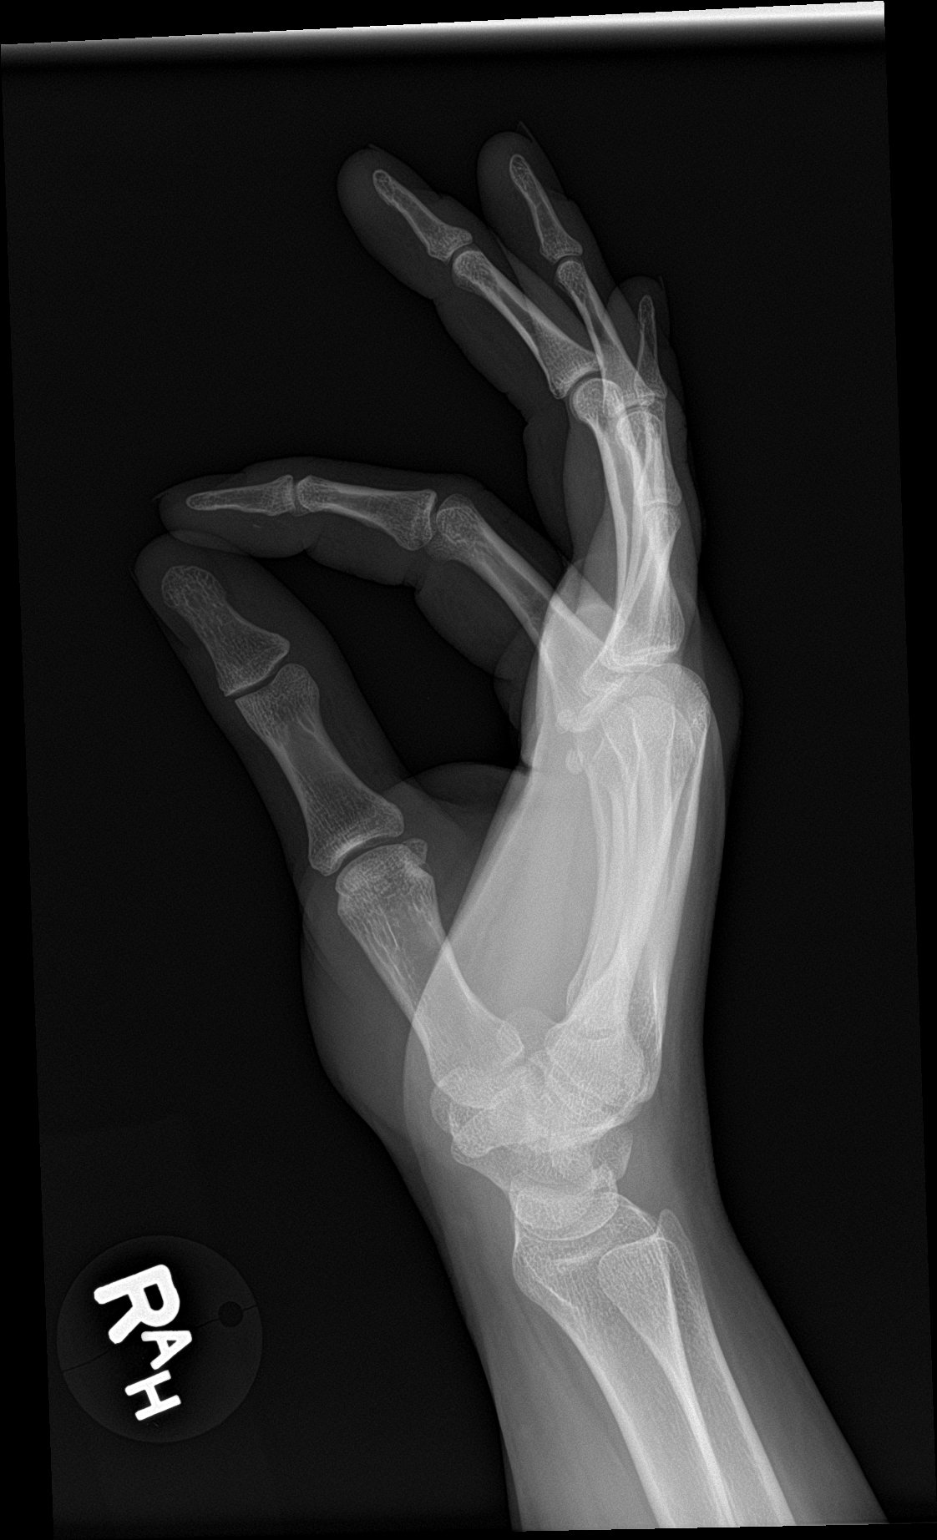

[3 of 3 positions shown; findings below may reference images not displayed]

FINDINGS: There is no evidence of fracture or dislocation. There is no
evidence of arthropathy or other focal bone abnormality. Soft
tissues are unremarkable.
IMPRESSION: Negative.
